# Patient Record
Sex: Female | Born: 1990 | Race: Black or African American | Hispanic: No | Smoking: Never smoker
Health system: Southern US, Community
[De-identification: ages and names within clinical notes are randomized; demographics above are authoritative.]

## PROBLEM LIST (undated history)

## (undated) DIAGNOSIS — F419 Anxiety disorder, unspecified: Secondary | ICD-10-CM

## (undated) DIAGNOSIS — F32A Depression, unspecified: Secondary | ICD-10-CM

## (undated) DIAGNOSIS — L739 Follicular disorder, unspecified: Secondary | ICD-10-CM

## (undated) DIAGNOSIS — F329 Major depressive disorder, single episode, unspecified: Secondary | ICD-10-CM

## (undated) DIAGNOSIS — J45909 Unspecified asthma, uncomplicated: Secondary | ICD-10-CM

## (undated) DIAGNOSIS — K589 Irritable bowel syndrome without diarrhea: Secondary | ICD-10-CM

## (undated) DIAGNOSIS — F410 Panic disorder [episodic paroxysmal anxiety] without agoraphobia: Secondary | ICD-10-CM

## (undated) DIAGNOSIS — L732 Hidradenitis suppurativa: Secondary | ICD-10-CM

## (undated) DIAGNOSIS — K469 Unspecified abdominal hernia without obstruction or gangrene: Secondary | ICD-10-CM

## (undated) HISTORY — PX: HERNIA REPAIR: SHX51

---

## 2012-07-22 ENCOUNTER — Encounter (HOSPITAL_COMMUNITY): Payer: Self-pay

## 2012-07-22 ENCOUNTER — Emergency Department (HOSPITAL_COMMUNITY)
Admission: EM | Admit: 2012-07-22 | Discharge: 2012-07-22 | Disposition: A | Discharge status: Home / Self Care | Payer: Self-pay | Attending: Emergency Medicine | Admitting: Emergency Medicine

## 2012-07-22 DIAGNOSIS — K529 Noninfective gastroenteritis and colitis, unspecified: Secondary | ICD-10-CM

## 2012-07-22 DIAGNOSIS — Z8719 Personal history of other diseases of the digestive system: Secondary | ICD-10-CM | POA: Insufficient documentation

## 2012-07-22 DIAGNOSIS — R1032 Left lower quadrant pain: Secondary | ICD-10-CM | POA: Insufficient documentation

## 2012-07-22 DIAGNOSIS — R197 Diarrhea, unspecified: Secondary | ICD-10-CM | POA: Insufficient documentation

## 2012-07-22 DIAGNOSIS — J45909 Unspecified asthma, uncomplicated: Secondary | ICD-10-CM | POA: Insufficient documentation

## 2012-07-22 HISTORY — DX: Unspecified asthma, uncomplicated: J45.909

## 2012-07-22 HISTORY — DX: Unspecified abdominal hernia without obstruction or gangrene: K46.9

## 2012-07-22 LAB — COMPREHENSIVE METABOLIC PANEL
ALT: 13 U/L (ref 0–35)
AST: 21 U/L (ref 0–37)
Albumin: 3.6 g/dL (ref 3.5–5.2)
Calcium: 9.4 mg/dL (ref 8.4–10.5)
Sodium: 138 mEq/L (ref 135–145)
Total Protein: 7.1 g/dL (ref 6.0–8.3)

## 2012-07-22 LAB — URINALYSIS, ROUTINE W REFLEX MICROSCOPIC
Glucose, UA: NEGATIVE mg/dL
Hgb urine dipstick: NEGATIVE
Specific Gravity, Urine: 1.02 (ref 1.005–1.030)

## 2012-07-22 MED ORDER — DIPHENOXYLATE-ATROPINE 2.5-0.025 MG PO TABS: 1.0000 | ORAL_TABLET | Freq: Four times a day (QID) | ORAL | Status: DC | PRN

## 2012-07-22 MED ORDER — SODIUM CHLORIDE 0.9 % IV SOLN
20.0000 mL | INTRAVENOUS | Status: DC
  Administered 2012-07-22: 20 mL via INTRAVENOUS

## 2012-07-22 NOTE — ED Notes (Signed)
Patient did not acknowledge pain upon deep palpation-stated it just feels funny.

## 2012-07-22 NOTE — ED Notes (Signed)
Patient has been having diarrhea since January and nausea since about 2-3 weeks ago. Pain is relieved when she lays supine or flat on back. Taking 800mg  ibuprofen which also relieves pain. Patient feeling bloated most of day is passing flatus. Stomach churns as though she is [place].States she feels as though she is

## 2012-07-22 NOTE — ED Notes (Signed)
Pt presents with NAD- Pt reports diarrhea since January however increased over pass two weeks.  increased stomach pain started yesterday.  C/o of swelling of feet and leg swelling.  C/o of nausea only

## 2012-07-22 NOTE — ED Provider Notes (Signed)
History     CSN: [id number]  Arrival date & time 07/22/12  1341   First MD Initiated Contact with Patient 07/22/12 1500      Chief Complaint  Patient presents with  . Diarrhea    x 2 weeks  . Abdominal Pain    (Consider location/radiation/quality/duration/timing/severity/associated sxs/prior treatment) HPI Comments: 22 y.o. Female presents complaining of waxing and waning central and lower left abdominal pain since October, diarrhea on and off since January, and unilateral edema of left lower extremity for the last year.   Pt denies fever, chest pain, shortness of breath, headache, exogenous estrogen use, smoking, recent travel/surgery in the last 4 weeks.   Pt has taken no interventions. Nothing maks it better or worse.    Patient is a 22 y.o. female presenting with diarrhea and abdominal pain.  Diarrhea Associated symptoms: abdominal pain   Associated symptoms: no diaphoresis, no fever, no headaches and no vomiting   Abdominal Pain Associated symptoms: diarrhea   Associated symptoms: no chest pain, no constipation, no dysuria, no fever, no nausea, no shortness of breath and no vomiting     Past Medical History  Diagnosis Date  . Asthma   . Hernia     History reviewed. No pertinent past surgical history.  No family history on file.  History  Substance Use Topics  . Smoking status: Never Smoker   . Smokeless tobacco: Not on file  . Alcohol Use: No    OB History   Grav Para Term Preterm Abortions TAB SAB Ect Mult Living                  Review of Systems  Constitutional: Negative for fever and diaphoresis.  HENT: Negative for neck pain and neck stiffness.   Eyes: Negative for visual disturbance.  Respiratory: Negative for apnea, chest tightness and shortness of breath.   Cardiovascular: Negative for chest pain and palpitations.  Gastrointestinal: Positive for abdominal pain and diarrhea. Negative for nausea, vomiting and constipation.       Indicates  central abdominal pain  Genitourinary: Negative for dysuria.  Musculoskeletal: Negative for gait problem.  Skin: Negative for rash.  Neurological: Negative for dizziness, weakness, light-headedness, numbness and headaches.    Allergies  Review of patient's allergies indicates no known allergies.  Home Medications   Current Outpatient Rx  Name  Route  Sig  Dispense  Refill  . ibuprofen (ADVIL,MOTRIN) 800 MG tablet   Oral   Take 800 mg by mouth every 8 (eight) hours as needed for pain.           BP 119/75  Pulse 93  Temp(Src) 98.3 F (36.8 C) (Oral)  Resp 16  Ht 5\' 3"  (1.6 m)  Wt 170 lb (77.111 kg)  BMI 30.12 kg/m2  SpO2 100%  LMP 07/03/2012  Physical Exam  Nursing note and vitals reviewed. Constitutional: She is oriented to person, place, and time. She appears well-developed and well-nourished. No distress.  HENT:  Head: Normocephalic and atraumatic.  Eyes: Conjunctivae and EOM are normal.  Neck: Normal range of motion. Neck supple.  No meningeal signs  Cardiovascular: Normal rate, regular rhythm and normal heart sounds.  Exam reveals no gallop and no friction rub.   No murmur heard. Pulmonary/Chest: Effort normal and breath sounds normal. No respiratory distress. She has no wheezes. She has no rales. She exhibits no tenderness.  Abdominal: Soft. Bowel sounds are normal. She exhibits no distension and no mass. There is tenderness. There is no  rebound and no guarding.  Tender to palpation left lower quadrant, no pain at McBurney's point, no murphy sign  Musculoskeletal: Normal range of motion. She exhibits no edema and no tenderness.  5/5 strength throughout. Good grip strength  Neurological: She is alert and oriented to person, place, and time. No cranial nerve deficit.  No focal deficits  Skin: Skin is warm and dry. She is not diaphoretic. No erythema.  Psychiatric: She has a normal mood and affect.    ED Course  Procedures (including critical care time)  Labs  Reviewed  COMPREHENSIVE METABOLIC PANEL  LIPASE, BLOOD  URINALYSIS, ROUTINE W REFLEX MICROSCOPIC   No results found.   1. Chronic diarrhea       MDM  Low suspicion for pancreatitis or hernia. More suspecting for an IBS. Patient is nontoxic, nonseptic appearing, in no apparent distress.   Labs, imaging and vitals reviewed.  Patient does not meet the SIRS or Sepsis criteria.  On repeat exam patient does not have a surgical abdomen and there are no peritoneal signs.  No indication of appendicitis, bowel obstruction, bowel perforation, cholecystitis, diverticulitis.  Patient discharged home with symptomatic treatment and given strict instructions for follow-up with their primary care physician.  I have also discussed reasons to return immediately to the ER.  Patient expresses understanding and agrees with plan. Discussed with Dr. Adriana Simas who is in agreement with plan.      Glade Nurse, PA-C 07/22/12 2146

## 2012-07-24 NOTE — ED Provider Notes (Signed)
Medical screening examination/treatment/procedure(s) were conducted as a shared visit with non-physician practitioner(s) and myself.  I personally evaluated the patient during the encounter.  No acute abd.  Non toxic  Donnetta Hutching, MD 07/24/12 1052

## 2013-09-12 ENCOUNTER — Other Ambulatory Visit: Payer: Self-pay

## 2013-09-12 ENCOUNTER — Encounter (HOSPITAL_COMMUNITY): Payer: Self-pay | Admitting: Emergency Medicine

## 2013-09-12 ENCOUNTER — Emergency Department (HOSPITAL_COMMUNITY)
Admission: EM | Admit: 2013-09-12 | Discharge: 2013-09-12 | Disposition: A | Discharge status: Home / Self Care | Payer: Self-pay | Attending: Emergency Medicine | Admitting: Emergency Medicine

## 2013-09-12 DIAGNOSIS — S0990XA Unspecified injury of head, initial encounter: Secondary | ICD-10-CM | POA: Insufficient documentation

## 2013-09-12 DIAGNOSIS — Z79899 Other long term (current) drug therapy: Secondary | ICD-10-CM | POA: Insufficient documentation

## 2013-09-12 DIAGNOSIS — R296 Repeated falls: Secondary | ICD-10-CM | POA: Insufficient documentation

## 2013-09-12 DIAGNOSIS — Z3202 Encounter for pregnancy test, result negative: Secondary | ICD-10-CM | POA: Insufficient documentation

## 2013-09-12 DIAGNOSIS — R42 Dizziness and giddiness: Secondary | ICD-10-CM | POA: Insufficient documentation

## 2013-09-12 DIAGNOSIS — R55 Syncope and collapse: Secondary | ICD-10-CM | POA: Insufficient documentation

## 2013-09-12 DIAGNOSIS — Z8719 Personal history of other diseases of the digestive system: Secondary | ICD-10-CM | POA: Insufficient documentation

## 2013-09-12 DIAGNOSIS — Y9289 Other specified places as the place of occurrence of the external cause: Secondary | ICD-10-CM | POA: Insufficient documentation

## 2013-09-12 DIAGNOSIS — J45909 Unspecified asthma, uncomplicated: Secondary | ICD-10-CM | POA: Insufficient documentation

## 2013-09-12 DIAGNOSIS — Y9389 Activity, other specified: Secondary | ICD-10-CM | POA: Insufficient documentation

## 2013-09-12 LAB — I-STAT CHEM 8, ED
BUN: 11 mg/dL (ref 6–23)
CALCIUM ION: 1.15 mmol/L (ref 1.12–1.23)
CREATININE: 1 mg/dL (ref 0.50–1.10)
Chloride: 106 mEq/L (ref 96–112)
GLUCOSE: 89 mg/dL (ref 70–99)
HCT: 40 % (ref 36.0–46.0)
HEMOGLOBIN: 13.6 g/dL (ref 12.0–15.0)
POTASSIUM: 4 meq/L (ref 3.7–5.3)
Sodium: 139 mEq/L (ref 137–147)
TCO2: 24 mmol/L (ref 0–100)

## 2013-09-12 LAB — POC URINE PREG, ED: Preg Test, Ur: NEGATIVE

## 2013-09-12 MED ORDER — ACETAMINOPHEN 500 MG PO TABS
1000.0000 mg | ORAL_TABLET | Freq: Once | ORAL | Status: AC
  Administered 2013-09-12: 1000 mg via ORAL
  Filled 2013-09-12: qty 2

## 2013-09-12 MED ORDER — SODIUM CHLORIDE 0.9 % IV BOLUS (SEPSIS): 1000.0000 mL | Freq: Once | INTRAVENOUS | Status: DC

## 2013-09-12 NOTE — ED Provider Notes (Signed)
CSN: [id number]     Arrival date & time 09/12/13  2029 History   First MD Initiated Contact with Patient 09/12/13 2030     Chief Complaint  Patient presents with  . Loss of Consciousness     (Consider location/radiation/quality/duration/timing/severity/associated sxs/prior Treatment) Patient is a 23 y.o. female presenting with syncope. The history is provided by the patient.  Loss of Consciousness Episode history:  Single Most recent episode:  Today Duration:  30 seconds Timing:  Rare Progression:  Resolved Chronicity:  New Context comment:  Bending over at work Witnessed: yes   Relieved by:  Nothing Worsened by:  Nothing tried Ineffective treatments:  None tried Associated symptoms: dizziness (Just prior to falling) and headaches (After falling)   Associated symptoms: no anxiety, no confusion, no diaphoresis, no fever, no focal weakness, no palpitations, no shortness of breath, no vomiting and no weakness     Past Medical History  Diagnosis Date  . Asthma   . Hernia    History reviewed. No pertinent past surgical history. No family history on file. History  Substance Use Topics  . Smoking status: Never Smoker   . Smokeless tobacco: Not on file  . Alcohol Use: No   OB History   Grav Para Term Preterm Abortions TAB SAB Ect Mult Living                 Review of Systems  Constitutional: Negative for fever and diaphoresis.  Respiratory: Negative for shortness of breath.   Cardiovascular: Positive for syncope. Negative for palpitations.  Gastrointestinal: Negative for vomiting.  Neurological: Positive for dizziness (Just prior to falling) and headaches (After falling). Negative for focal weakness and weakness.  Psychiatric/Behavioral: Negative for confusion.  All other systems reviewed and are negative.     Allergies  Review of patient's allergies indicates no known allergies.  Home Medications   Prior to Admission medications   Medication Sig Start Date End  Date Taking? Authorizing Provider  drospirenone-ethinyl estradiol (YAZ,GIANVI,LORYNA) 3-0.02 MG tablet Take 1 tablet by mouth daily.   Yes Historical Provider, MD  FLUoxetine (PROZAC) 10 MG tablet Take 30 mg by mouth daily.   Yes Historical Provider, MD   BP 113/76  Pulse 87  Temp(Src) 98.8 F (37.1 C) (Oral)  Resp 19  SpO2 99% Physical Exam  Constitutional: She is oriented to person, place, and time. She appears well-developed and well-nourished. No distress.  HENT:  Head: Normocephalic and atraumatic.  Eyes: Conjunctivae are normal.  Neck: Neck supple. No tracheal deviation present.  Cardiovascular: Normal rate and regular rhythm.   Pulmonary/Chest: Effort normal. No respiratory distress.  Abdominal: Soft. She exhibits no distension. There is no tenderness.  Neurological: She is alert and oriented to person, place, and time. She has normal strength. No cranial nerve deficit or sensory deficit. Coordination and gait normal. GCS eye subscore is 4. GCS verbal subscore is 5. GCS motor subscore is 6.  Skin: Skin is warm and dry.  Psychiatric: She has a normal mood and affect.    ED Course  Procedures (including critical care time) Labs Review Labs Reviewed  POC URINE PREG, ED  I-STAT CHEM 8, ED    Imaging Review No results found.  EKG interpretation  Date: 09/12/2013  Rate: 87  Rhythm: normal sinus rhythm  QRS Axis: normal  Intervals: normal  ST/T Wave abnormalities: normal and early repolarization  Conduction Disutrbances:none  Narrative Interpretation:   Old EKG Reviewed: none available     MDM   Final  diagnoses:  None   23 y.o. female presents with a syncopal event while at work. She was filling up a popcorn container with butter and felt lightheaded and then the next thing she remembers she was lying on the floor and being helped up by a coworker. She does not have a history of losing consciousness in the past. EKG without ST or T wave changes concerning for  myocardial ischemia. No delta wave, no prolonged QTc, no brugada to suggest arrhythmogenicity. She is not exhibiting orthostasis at this time. She is on her menstrual cycle currently and states it has been happening longer than it normally does despite her birth control regimen. Electrolytes and hemoglobin are normal so not symptomatic anemia. No recurrence during emergency department stay, safe for discharge home with return precautions for repeat events and primary care followup for routine health maintenance.     Lyndal Pulleyaniel Amneet Cendejas, MD 09/12/13 2152

## 2013-09-12 NOTE — Discharge Instructions (Signed)
Syncope  Syncope is a fainting spell. This means the person loses consciousness and drops to the ground. The person is generally unconscious for less than 5 minutes. The person may have some muscle twitches for up to 15 seconds before waking up and returning to normal. Syncope occurs more often in elderly people, but it can happen to anyone. While most causes of syncope are not dangerous, syncope can be a sign of a serious medical problem. It is important to seek medical care.   CAUSES   Syncope is caused by a sudden decrease in blood flow to the brain. The specific cause is often not determined. Factors that can trigger syncope include:   Taking medicines that lower blood pressure.   Sudden changes in posture, such as standing up suddenly.   Taking more medicine than prescribed.   Standing in one place for too long.   Seizure disorders.   Dehydration and excessive exposure to heat.   Low blood sugar (hypoglycemia).   Straining to have a bowel movement.   Heart disease, irregular heartbeat, or other circulatory problems.   Fear, emotional distress, seeing blood, or severe pain.  SYMPTOMS   Right before fainting, you may:   Feel dizzy or lightheaded.   Feel nauseous.   See all white or all black in your field of vision.   Have cold, clammy skin.  DIAGNOSIS   Your caregiver will ask about your symptoms, perform a physical exam, and perform electrocardiography (ECG) to record the electrical activity of your heart. Your caregiver may also perform other heart or blood tests to determine the cause of your syncope.  TREATMENT   In most cases, no treatment is needed. Depending on the cause of your syncope, your caregiver may recommend changing or stopping some of your medicines.  HOME CARE INSTRUCTIONS   Have someone stay with you until you feel stable.   Do not drive, operate machinery, or play sports until your caregiver says it is okay.   Keep all follow-up appointments as directed by your  caregiver.   Lie down right away if you start feeling like you might faint. Breathe deeply and steadily. Wait until all the symptoms have passed.   Drink enough fluids to keep your urine clear or pale yellow.   If you are taking blood pressure or heart medicine, get up slowly, taking several minutes to sit and then stand. This can reduce dizziness.  SEEK IMMEDIATE MEDICAL CARE IF:    You have a severe headache.   You have unusual pain in the chest, abdomen, or back.   You are bleeding from the mouth or rectum, or you have black or tarry stool.   You have an irregular or very fast heartbeat.   You have pain with breathing.   You have repeated fainting or seizure-like jerking during an episode.   You faint when sitting or lying down.   You have confusion.   You have difficulty walking.   You have severe weakness.   You have vision problems.  If you fainted, call your local emergency services (911 in [place] Do not drive yourself to the hospital.   MAKE SURE YOU:   Understand these instructions.   Will watch your condition.   Will get help right away if you are not doing well or get worse.  Document Released: 04/18/2005 Document Revised: 10/18/2011 Document Reviewed: 06/17/2011  ExitCare Patient Information 2014 [employer].

## 2013-09-12 NOTE — ED Notes (Signed)
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 Pt does not recall passing out. Remember people standing around her when she woke up. Pt currently Ax4, NAD at this time. Pt has 20g IV started by EMS. Able to answer all questions appropriately.

## 2013-09-13 NOTE — ED Provider Notes (Signed)
I saw and evaluated the patient, reviewed the resident's note and I agree with the findings and plan.  Date: 09/12/2013  Rate: 87  Rhythm: normal sinus rhythm  QRS Axis: normal  Intervals: normal  ST/T Wave abnormalities: normal and early repolarization  Conduction Disutrbances:none  Narrative Interpretation:  Old EKG Reviewed: none available   52F here with syncopal episode. Happened while at work and was yawning. Relaxing comfortably now. No preceding symptoms. Patient's EKG ok, labs ok. Stable for discharge.     Dagmar HaitWilliam Uriyah Raska, MD 09/13/13 [phone]

## 2018-04-23 ENCOUNTER — Other Ambulatory Visit: Payer: Self-pay

## 2018-04-23 ENCOUNTER — Encounter (HOSPITAL_COMMUNITY): Payer: Self-pay

## 2018-04-23 ENCOUNTER — Emergency Department (HOSPITAL_COMMUNITY): Payer: Medicaid Other

## 2018-04-23 ENCOUNTER — Emergency Department (HOSPITAL_COMMUNITY)
Admission: EM | Admit: 2018-04-23 | Discharge: 2018-04-23 | Disposition: A | Discharge status: Home / Self Care | Payer: Medicaid Other | Attending: Emergency Medicine | Admitting: Emergency Medicine

## 2018-04-23 DIAGNOSIS — Z3A09 9 weeks gestation of pregnancy: Secondary | ICD-10-CM | POA: Diagnosis not present

## 2018-04-23 DIAGNOSIS — R1030 Lower abdominal pain, unspecified: Secondary | ICD-10-CM

## 2018-04-23 DIAGNOSIS — O26891 Other specified pregnancy related conditions, first trimester: Secondary | ICD-10-CM | POA: Insufficient documentation

## 2018-04-23 DIAGNOSIS — Z3490 Encounter for supervision of normal pregnancy, unspecified, unspecified trimester: Secondary | ICD-10-CM

## 2018-04-23 LAB — CBC WITH DIFFERENTIAL/PLATELET
ABS IMMATURE GRANULOCYTES: 0.02 10*3/uL (ref 0.00–0.07)
Basophils Absolute: 0 10*3/uL (ref 0.0–0.1)
Basophils Relative: 0 %
Eosinophils Absolute: 0.1 10*3/uL (ref 0.0–0.5)
Eosinophils Relative: 1 %
HEMATOCRIT: 37 % (ref 36.0–46.0)
Hemoglobin: 11.9 g/dL — ABNORMAL LOW (ref 12.0–15.0)
IMMATURE GRANULOCYTES: 0 %
LYMPHS ABS: 1.5 10*3/uL (ref 0.7–4.0)
Lymphocytes Relative: 21 %
MCH: 28.5 pg (ref 26.0–34.0)
MCHC: 32.2 g/dL (ref 30.0–36.0)
MCV: 88.7 fL (ref 80.0–100.0)
MONOS PCT: 9 %
Monocytes Absolute: 0.6 10*3/uL (ref 0.1–1.0)
NEUTROS ABS: 4.9 10*3/uL (ref 1.7–7.7)
NEUTROS PCT: 69 %
Platelets: 205 10*3/uL (ref 150–400)
RBC: 4.17 MIL/uL (ref 3.87–5.11)
RDW: 17.1 % — ABNORMAL HIGH (ref 11.5–15.5)
WBC: 7.1 10*3/uL (ref 4.0–10.5)
nRBC: 0 % (ref 0.0–0.2)

## 2018-04-23 LAB — COMPREHENSIVE METABOLIC PANEL
ALBUMIN: 3.2 g/dL — AB (ref 3.5–5.0)
ALK PHOS: 44 U/L (ref 38–126)
ALT: 14 U/L (ref 0–44)
ANION GAP: 6 (ref 5–15)
AST: 19 U/L (ref 15–41)
BUN: 5 mg/dL — ABNORMAL LOW (ref 6–20)
CALCIUM: 8.9 mg/dL (ref 8.9–10.3)
CO2: 22 mmol/L (ref 22–32)
Chloride: 109 mmol/L (ref 98–111)
Creatinine, Ser: 0.73 mg/dL (ref 0.44–1.00)
GFR calc Af Amer: >60 mL/min (ref >60)
GFR calc non Af Amer: >60 mL/min (ref >60)
GLUCOSE: 84 mg/dL (ref 70–99)
POTASSIUM: 4 mmol/L (ref 3.5–5.1)
SODIUM: 137 mmol/L (ref 135–145)
Total Bilirubin: 0.4 mg/dL (ref 0.3–1.2)
Total Protein: 6.1 g/dL — ABNORMAL LOW (ref 6.5–8.1)

## 2018-04-23 LAB — URINALYSIS, ROUTINE W REFLEX MICROSCOPIC
Bilirubin Urine: NEGATIVE
GLUCOSE, UA: NEGATIVE mg/dL
HGB URINE DIPSTICK: NEGATIVE
Ketones, ur: 5 mg/dL — AB
NITRITE: NEGATIVE
PROTEIN: NEGATIVE mg/dL
SPECIFIC GRAVITY, URINE: 1.01 (ref 1.005–1.030)
pH: 7 (ref 5.0–8.0)

## 2018-04-23 LAB — WET PREP, GENITAL
Clue Cells Wet Prep HPF POC: NONE SEEN
SPERM: NONE SEEN
Trich, Wet Prep: NONE SEEN
YEAST WET PREP: NONE SEEN

## 2018-04-23 LAB — PREGNANCY, URINE: Preg Test, Ur: POSITIVE — AB

## 2018-04-23 LAB — HCG, QUANTITATIVE, PREGNANCY: hCG, Beta Chain, Quant, S: 300330 m[IU]/mL — ABNORMAL HIGH (ref <5)

## 2018-04-23 NOTE — ED Provider Notes (Signed)
Patient care was taken over from Dr. Jacqulyn BathLong.  Pelvic ultrasound shows a normal intrauterine pregnancy with no obvious complicating features.  Her urinalysis does not appear consistent with infection.  It was sent for culture.  She is currently not having significant abdominal pain.  She has an appointment with the health department on Friday.  She is taking a prenatal vitamin.  Return precautions were given.   Rolan BuccoBelfi, Sephiroth Mcluckie, MD 04/23/18 [phone]

## 2018-04-23 NOTE — ED Triage Notes (Signed)
Pt reports abdominal pain for the last two days that radiates into her lower back. Pt did have one episode of diarrhea this morning. Pt states that she is pregnant with her LMP being 02/12/18. Pt reports that her first OB appointment is on Friday.

## 2018-04-23 NOTE — ED Notes (Signed)
Patient verbalizes understanding of medications and discharge instructions. No further questions at this time. VSS and patient ambulatory at discharge.   

## 2018-04-23 NOTE — ED Provider Notes (Signed)
Emergency Department Provider Note   I have reviewed the triage vital signs and the nursing notes.   HISTORY  Chief Complaint Abdominal Pain   HPI Cheyenne Sutton is a 27 y.o. female with PMH of hernia presents to the emergency department with lower abdominal pain radiating to the back in the setting of early pregnancy.  The patient's last menstrual cycle was 02/12/2018.  The patient has not seen her OB/GYN but is scheduled to go later this week.  Is her first pregnancy.  No vaginal bleeding but she has noticed some discharge over the past several weeks.  No concern for STD.  Her lower abdominal pain is intermittent and radiates to the back.  No upper abdominal discomfort.  No diarrhea, vomiting, or fevers.   Past Medical History:  Diagnosis Date  . Asthma   . Hernia     There are no active problems to display for this patient.   No past surgical history on file.  Allergies Patient has no known allergies.  No family history on file.  Social History Social History   Tobacco Use  . Smoking status: Never Smoker  Substance Use Topics  . Alcohol use: No  . Drug use: No    Review of Systems  Constitutional: No fever/chills Eyes: No visual changes. ENT: No sore throat. Cardiovascular: Denies chest pain. Respiratory: Denies shortness of breath. Gastrointestinal: Positive lower abdominal pain.  No nausea, no vomiting.  No diarrhea.  No constipation. Genitourinary: Negative for dysuria. Positive vaginal discharge.  Musculoskeletal: Negative for back pain. Skin: Negative for rash. Neurological: Negative for headaches, focal weakness or numbness.  10-point ROS otherwise negative.  ____________________________________________   PHYSICAL EXAM:  VITAL SIGNS: Vitals:   04/23/18 1242 04/23/18 1415  BP: 114/86 104/65  Pulse:  71  Resp:  18  SpO2:  100%   Constitutional: Alert and oriented. Well appearing and in no acute distress. Eyes: Conjunctivae are normal.    Head: Atraumatic. Nose: No congestion/rhinnorhea. Mouth/Throat: Mucous membranes are moist.  Neck: No stridor.   Cardiovascular: Normal rate, regular rhythm. Good peripheral circulation. Grossly normal heart sounds.   Respiratory: Normal respiratory effort.  No retractions. Lungs CTAB. Gastrointestinal: Soft and nontender. No distention.  Genitourinary: Moderate white discharge. Normal, closed cervix. Mild adnexal tenderness. No vaginal bleeding.  Musculoskeletal: No lower extremity tenderness nor edema. No gross deformities of extremities. Neurologic:  Normal speech and language. No gross focal neurologic deficits are appreciated.  Skin:  Skin is warm, dry and intact. No rash noted.  ____________________________________________   LABS (all labs ordered are listed, but only abnormal results are displayed)  Labs Reviewed  WET PREP, GENITAL - Abnormal; Notable for the following components:      Result Value   WBC, Wet Prep HPF POC MANY (*)    All other components within normal limits  COMPREHENSIVE METABOLIC PANEL - Abnormal; Notable for the following components:   BUN 5 (*)    Total Protein 6.1 (*)    Albumin 3.2 (*)    All other components within normal limits  CBC WITH DIFFERENTIAL/PLATELET - Abnormal; Notable for the following components:   Hemoglobin 11.9 (*)    RDW 17.1 (*)    All other components within normal limits  HCG, QUANTITATIVE, PREGNANCY - Abnormal; Notable for the following components:   hCG, Beta Chain, Quant, S 300,330 (*)    All other components within normal limits  PREGNANCY, URINE  URINALYSIS, ROUTINE W REFLEX MICROSCOPIC  HIV ANTIBODY (ROUTINE TESTING W  REFLEX)  GC/CHLAMYDIA PROBE AMP ([place]) NOT AT [facility]   ____________________________________________  RADIOLOGY  Pelvic US pending.  ____________________________________________   PROCEDURES  Procedure(s) performed:   Procedures  None   ____________________________________________   INITIAL IMPRESSION / ASSESSMENT AND PLAN / ED COURSE  Pertinent labs & imaging results that were available during my care of the patient were reviewed by me and considered in my medical decision making (see chart for details).  Patient presents to the emergency department for evaluation of the lower abdominal pain and pregnancy.  She is approximately 10 weeks by dates with no OB follow-up at this time but has a appointment scheduled later this week.  No focal tenderness on exam.  Pelvic exam pending.   Labs reviewed. UA and pelvic US pending. Care transferred to Dr. Fredderick PhenixBelfi. Patient has OB f/u later this week.  ____________________________________________  FINAL CLINICAL IMPRESSION(S) / ED DIAGNOSES  Final diagnoses:  Lower abdominal pain  Pregnancy, unspecified gestational age    Note:  This document was prepared using Dragon voice recognition software and may include unintentional dictation errors.  Alona BeneJoshua Long, MD Emergency Medicine    Long, Arlyss RepressJoshua G, MD 04/23/18 [phone]

## 2018-04-24 LAB — HIV ANTIBODY (ROUTINE TESTING W REFLEX): HIV Screen 4th Generation wRfx: NONREACTIVE

## 2018-04-24 LAB — GC/CHLAMYDIA PROBE AMP (~~LOC~~) NOT AT ARMC
CHLAMYDIA, DNA PROBE: NEGATIVE
NEISSERIA GONORRHEA: NEGATIVE

## 2018-04-25 LAB — URINE CULTURE: Culture: >=100000 — AB

## 2018-04-26 ENCOUNTER — Telehealth: Payer: Self-pay | Admitting: Emergency Medicine

## 2018-04-26 NOTE — Progress Notes (Signed)
ED Antimicrobial Stewardship Positive Culture Follow Up   Jamison NeighborSage Neighbors is an 27 y.o. female who [facility] with a chief complaint of Abdominal pain in setting of early pregnancy Chief Complaint  Patient presents with  . Abdominal Pain    Recent Results (from the past 720 hour(s))  Wet prep, genital     Status: Abnormal   Collection Time: 04/23/18  2:03 PM  Result Value Ref Range Status   Yeast Wet Prep HPF POC NONE SEEN NONE SEEN Final   Trich, Wet Prep NONE SEEN NONE SEEN Final   Clue Cells Wet Prep HPF POC NONE SEEN NONE SEEN Final   WBC, Wet Prep HPF POC MANY (A) NONE SEEN Final   Sperm NONE SEEN  Final    Comment: Performed at [facility], [address] [place], [place] [address]  Urine culture     Status: Abnormal   Collection Time: 04/23/18  6:50 PM  Result Value Ref Range Status   Specimen Description URINE, RANDOM  Final   Special Requests   Final    NONE Performed at [facility], [address] [place], [place] [address]    Culture >=100,000 COLONIES/mL ESCHERICHIA COLI (A)  Final   Report Status 04/25/2018 FINAL  Final   Organism ID, Bacteria ESCHERICHIA COLI (A)  Final      Susceptibility   Escherichia coli - MIC*    AMPICILLIN >=32 RESISTANT Resistant     CEFAZOLIN <=4 SENSITIVE Sensitive     CEFTRIAXONE <=1 SENSITIVE Sensitive     CIPROFLOXACIN <=0.25 SENSITIVE Sensitive     GENTAMICIN <=1 SENSITIVE Sensitive     IMIPENEM <=0.25 SENSITIVE Sensitive     NITROFURANTOIN <=16 SENSITIVE Sensitive     TRIMETH/SULFA <=20 SENSITIVE Sensitive     AMPICILLIN/SULBACTAM 16 INTERMEDIATE Intermediate     PIP/TAZO <=4 SENSITIVE Sensitive     Extended ESBL NEGATIVE Sensitive     * >=100,000 COLONIES/mL ESCHERICHIA COLI    [x]  Patient discharged originally without antimicrobial agent and treatment is now indicated  New antibiotic prescription: Keflex 500mg  BID x 7d  ED Provider: Claude MangesJohana Soto, Migdalia DkPA-C   Aloysius Heinle 04/26/2018, 1:33 PM Clinical Pharmacist Monday - Friday phone -  [phone] Saturday - Sunday phone - [phone]

## 2018-04-26 NOTE — Telephone Encounter (Signed)
Post ED Visit - Positive Culture Follow-up: Successful Patient Follow-Up  Culture assessed and recommendations reviewed by:  []  Enzo BiNathan Batchelder, Pharm.D. []  Celedonio MiyamotoJeremy Frens, Pharm.D., BCPS AQ-ID []  Garvin FilaMike Maccia, Pharm.D., BCPS []  Georgina PillionElizabeth Martin, Pharm.D., BCPS []  ChilhowieMinh Pham, [place]arm.D., BCPS, AAHIVP []  Estella HuskMichelle Turner, Pharm.D., BCPS, AAHIVP []  Lysle Pearlachel Rumbarger, PharmD, BCPS []  Phillips Climeshuy Dang, PharmD, BCPS []  Agapito GamesAlison Masters, PharmD, BCPS []  Daylene PoseyJonathan Oriet, PharmD  Positive urine culture  [x]  Patient discharged without antimicrobial prescription and treatment is now indicated []  Organism is resistant to prescribed ED discharge antimicrobial []  Patient with positive blood cultures  Changes discussed with ED provider: Claude MangesJohana Soto PA New antibiotic prescription Keflex 500 mg BID x seven days [facility] [phone] [facility]  Contacted patient, date 04/26/2018, time 1220   [facility] Ninneman 04/26/2018, 12:22 PM

## 2018-05-04 ENCOUNTER — Other Ambulatory Visit (HOSPITAL_COMMUNITY): Payer: Self-pay | Admitting: Nurse Practitioner

## 2018-05-04 DIAGNOSIS — Z3A13 13 weeks gestation of pregnancy: Secondary | ICD-10-CM

## 2018-05-04 DIAGNOSIS — Z818 Family history of other mental and behavioral disorders: Secondary | ICD-10-CM

## 2018-05-11 ENCOUNTER — Encounter (HOSPITAL_COMMUNITY): Payer: Self-pay

## 2018-05-16 ENCOUNTER — Encounter (HOSPITAL_COMMUNITY): Payer: Self-pay | Admitting: *Deleted

## 2018-05-18 ENCOUNTER — Ambulatory Visit (HOSPITAL_COMMUNITY)
Admission: RE | Admit: 2018-05-18 | Discharge: 2018-05-18 | Disposition: A | Discharge status: Home / Self Care | Payer: Medicaid Other | Source: Ambulatory Visit | Attending: Nurse Practitioner | Admitting: Nurse Practitioner

## 2018-05-18 ENCOUNTER — Ambulatory Visit (HOSPITAL_COMMUNITY): Payer: Self-pay | Admitting: Nurse Practitioner

## 2018-05-18 ENCOUNTER — Encounter (HOSPITAL_COMMUNITY): Payer: Self-pay

## 2018-05-18 DIAGNOSIS — Z3686 Encounter for antenatal screening for cervical length: Secondary | ICD-10-CM

## 2018-05-18 DIAGNOSIS — Z3A13 13 weeks gestation of pregnancy: Secondary | ICD-10-CM

## 2018-05-18 DIAGNOSIS — Z818 Family history of other mental and behavioral disorders: Secondary | ICD-10-CM | POA: Diagnosis present

## 2018-05-18 HISTORY — DX: Irritable bowel syndrome, unspecified: K58.9

## 2018-05-18 HISTORY — DX: Depression, unspecified: F32.A

## 2018-05-18 HISTORY — DX: Major depressive disorder, single episode, unspecified: F32.9

## 2018-05-18 HISTORY — DX: Follicular disorder, unspecified: L73.9

## 2018-05-18 HISTORY — DX: Anxiety disorder, unspecified: F41.9

## 2018-05-18 HISTORY — DX: Hidradenitis suppurativa: L73.2

## 2018-05-21 LAB — HEMOGLOBINOPATHY EVALUATION
HGB A2 QUANT: 1.6 % — AB (ref 1.8–3.2)
HGB S QUANTITAION: 0 %
Hgb A: 98.4 % (ref 96.4–98.8)
Hgb C: 0 %
Hgb F Quant: 0 % (ref 0.0–2.0)
Hgb Variant: 0 %

## 2018-05-23 ENCOUNTER — Other Ambulatory Visit (HOSPITAL_COMMUNITY): Payer: Self-pay | Admitting: Nurse Practitioner

## 2018-05-24 ENCOUNTER — Other Ambulatory Visit (HOSPITAL_COMMUNITY): Payer: Self-pay | Admitting: Nurse Practitioner

## 2018-05-28 ENCOUNTER — Other Ambulatory Visit (HOSPITAL_COMMUNITY): Payer: Self-pay | Admitting: Nurse Practitioner

## 2018-05-31 ENCOUNTER — Other Ambulatory Visit (HOSPITAL_COMMUNITY): Payer: Self-pay | Admitting: Nurse Practitioner

## 2018-05-31 DIAGNOSIS — Z3689 Encounter for other specified antenatal screening: Secondary | ICD-10-CM

## 2018-06-29 ENCOUNTER — Other Ambulatory Visit (HOSPITAL_COMMUNITY): Payer: Medicaid Other

## 2018-06-29 ENCOUNTER — Ambulatory Visit (HOSPITAL_COMMUNITY)
Admission: RE | Admit: 2018-06-29 | Discharge: 2018-06-29 | Disposition: A | Discharge status: Home / Self Care | Payer: Medicaid Other | Source: Ambulatory Visit | Attending: Nurse Practitioner | Admitting: Nurse Practitioner

## 2018-06-29 ENCOUNTER — Other Ambulatory Visit (HOSPITAL_COMMUNITY): Payer: Self-pay | Admitting: *Deleted

## 2018-06-29 ENCOUNTER — Other Ambulatory Visit (HOSPITAL_COMMUNITY): Payer: Self-pay | Admitting: Nurse Practitioner

## 2018-06-29 DIAGNOSIS — Z3689 Encounter for other specified antenatal screening: Secondary | ICD-10-CM | POA: Diagnosis not present

## 2018-06-29 DIAGNOSIS — Z3A19 19 weeks gestation of pregnancy: Secondary | ICD-10-CM

## 2018-06-29 DIAGNOSIS — Z363 Encounter for antenatal screening for malformations: Secondary | ICD-10-CM

## 2018-06-29 DIAGNOSIS — O4440 Low lying placenta NOS or without hemorrhage, unspecified trimester: Secondary | ICD-10-CM

## 2018-09-07 ENCOUNTER — Encounter (HOSPITAL_COMMUNITY): Payer: Self-pay

## 2018-09-07 ENCOUNTER — Other Ambulatory Visit (HOSPITAL_COMMUNITY): Payer: Self-pay | Admitting: *Deleted

## 2018-09-07 ENCOUNTER — Other Ambulatory Visit: Payer: Self-pay

## 2018-09-07 ENCOUNTER — Ambulatory Visit (HOSPITAL_COMMUNITY)
Admission: RE | Admit: 2018-09-07 | Discharge: 2018-09-07 | Disposition: A | Discharge status: Home / Self Care | Payer: Medicaid Other | Source: Ambulatory Visit | Attending: Obstetrics and Gynecology | Admitting: Obstetrics and Gynecology

## 2018-09-07 ENCOUNTER — Ambulatory Visit (HOSPITAL_COMMUNITY): Payer: Medicaid Other | Admitting: *Deleted

## 2018-09-07 VITALS — Temp 98.6°F

## 2018-09-07 DIAGNOSIS — O4443 Low lying placenta NOS or without hemorrhage, third trimester: Secondary | ICD-10-CM | POA: Diagnosis not present

## 2018-09-07 DIAGNOSIS — O350XX0 Maternal care for (suspected) central nervous system malformation in fetus, not applicable or unspecified: Secondary | ICD-10-CM

## 2018-09-07 DIAGNOSIS — O358XX0 Maternal care for other (suspected) fetal abnormality and damage, not applicable or unspecified: Secondary | ICD-10-CM | POA: Insufficient documentation

## 2018-09-07 DIAGNOSIS — O0990 Supervision of high risk pregnancy, unspecified, unspecified trimester: Secondary | ICD-10-CM

## 2018-09-07 DIAGNOSIS — IMO0002 Reserved for concepts with insufficient information to code with codable children: Secondary | ICD-10-CM

## 2018-09-07 DIAGNOSIS — Z362 Encounter for other antenatal screening follow-up: Secondary | ICD-10-CM | POA: Diagnosis present

## 2018-09-07 DIAGNOSIS — O4440 Low lying placenta NOS or without hemorrhage, unspecified trimester: Secondary | ICD-10-CM

## 2018-09-07 DIAGNOSIS — Z3A29 29 weeks gestation of pregnancy: Secondary | ICD-10-CM | POA: Insufficient documentation

## 2018-10-05 ENCOUNTER — Other Ambulatory Visit: Payer: Self-pay

## 2018-10-05 ENCOUNTER — Other Ambulatory Visit (HOSPITAL_COMMUNITY): Payer: Self-pay | Admitting: *Deleted

## 2018-10-05 ENCOUNTER — Ambulatory Visit (HOSPITAL_COMMUNITY)
Admission: RE | Admit: 2018-10-05 | Discharge: 2018-10-05 | Disposition: A | Discharge status: Home / Self Care | Payer: Medicaid Other | Source: Ambulatory Visit | Attending: Obstetrics and Gynecology | Admitting: Obstetrics and Gynecology

## 2018-10-05 ENCOUNTER — Ambulatory Visit (HOSPITAL_COMMUNITY): Payer: Medicaid Other | Admitting: *Deleted

## 2018-10-05 VITALS — Temp 97.7°F

## 2018-10-05 DIAGNOSIS — O3506X0 Maternal care for (suspected) central nervous system malformation or damage in fetus, hydrocephaly, not applicable or unspecified: Secondary | ICD-10-CM

## 2018-10-05 DIAGNOSIS — O350XX0 Maternal care for (suspected) central nervous system malformation in fetus, not applicable or unspecified: Secondary | ICD-10-CM | POA: Insufficient documentation

## 2018-10-05 DIAGNOSIS — IMO0002 Reserved for concepts with insufficient information to code with codable children: Secondary | ICD-10-CM

## 2018-10-05 DIAGNOSIS — Z3A33 33 weeks gestation of pregnancy: Secondary | ICD-10-CM

## 2018-10-05 DIAGNOSIS — Z362 Encounter for other antenatal screening follow-up: Secondary | ICD-10-CM | POA: Diagnosis present

## 2018-10-25 LAB — OB RESULTS CONSOLE GBS: GBS: NEGATIVE

## 2018-11-01 ENCOUNTER — Other Ambulatory Visit: Payer: Self-pay

## 2018-11-01 ENCOUNTER — Ambulatory Visit (HOSPITAL_COMMUNITY)
Admission: RE | Admit: 2018-11-01 | Discharge: 2018-11-01 | Disposition: A | Discharge status: Home / Self Care | Payer: Medicaid Other | Source: Ambulatory Visit | Attending: Obstetrics and Gynecology | Admitting: Obstetrics and Gynecology

## 2018-11-01 DIAGNOSIS — Z362 Encounter for other antenatal screening follow-up: Secondary | ICD-10-CM | POA: Diagnosis not present

## 2018-11-01 DIAGNOSIS — Z3A37 37 weeks gestation of pregnancy: Secondary | ICD-10-CM | POA: Diagnosis not present

## 2018-11-01 DIAGNOSIS — IMO0002 Reserved for concepts with insufficient information to code with codable children: Secondary | ICD-10-CM

## 2018-11-01 DIAGNOSIS — O350XX0 Maternal care for (suspected) central nervous system malformation in fetus, not applicable or unspecified: Secondary | ICD-10-CM | POA: Diagnosis present

## 2018-11-01 DIAGNOSIS — O3506X0 Maternal care for (suspected) central nervous system malformation or damage in fetus, hydrocephaly, not applicable or unspecified: Secondary | ICD-10-CM

## 2018-11-16 ENCOUNTER — Encounter (HOSPITAL_COMMUNITY)
Admission: AD | Disposition: A | Discharge status: Home / Self Care | Payer: Self-pay | Source: Home / Self Care | Attending: Obstetrics and Gynecology

## 2018-11-16 ENCOUNTER — Inpatient Hospital Stay (HOSPITAL_COMMUNITY): Payer: Medicaid Other | Admitting: Anesthesiology

## 2018-11-16 ENCOUNTER — Other Ambulatory Visit: Payer: Self-pay

## 2018-11-16 ENCOUNTER — Inpatient Hospital Stay (HOSPITAL_COMMUNITY)
Admission: AD | Admit: 2018-11-16 | Discharge: 2018-11-19 | DRG: 788 | Disposition: A | Discharge status: Home / Self Care | Payer: Medicaid Other | Attending: Obstetrics and Gynecology | Admitting: Obstetrics and Gynecology

## 2018-11-16 ENCOUNTER — Inpatient Hospital Stay (HOSPITAL_COMMUNITY): Payer: Medicaid Other

## 2018-11-16 ENCOUNTER — Encounter (HOSPITAL_COMMUNITY): Payer: Self-pay

## 2018-11-16 DIAGNOSIS — O288 Other abnormal findings on antenatal screening of mother: Secondary | ICD-10-CM

## 2018-11-16 DIAGNOSIS — Z3A39 39 weeks gestation of pregnancy: Secondary | ICD-10-CM

## 2018-11-16 DIAGNOSIS — F419 Anxiety disorder, unspecified: Secondary | ICD-10-CM | POA: Diagnosis present

## 2018-11-16 DIAGNOSIS — O99344 Other mental disorders complicating childbirth: Secondary | ICD-10-CM | POA: Diagnosis present

## 2018-11-16 DIAGNOSIS — O4100X0 Oligohydramnios, unspecified trimester, not applicable or unspecified: Secondary | ICD-10-CM | POA: Diagnosis present

## 2018-11-16 DIAGNOSIS — O26893 Other specified pregnancy related conditions, third trimester: Secondary | ICD-10-CM

## 2018-11-16 DIAGNOSIS — O283 Abnormal ultrasonic finding on antenatal screening of mother: Secondary | ICD-10-CM | POA: Diagnosis not present

## 2018-11-16 DIAGNOSIS — O4103X0 Oligohydramnios, third trimester, not applicable or unspecified: Secondary | ICD-10-CM | POA: Diagnosis present

## 2018-11-16 DIAGNOSIS — F329 Major depressive disorder, single episode, unspecified: Secondary | ICD-10-CM | POA: Diagnosis present

## 2018-11-16 DIAGNOSIS — Z1159 Encounter for screening for other viral diseases: Secondary | ICD-10-CM

## 2018-11-16 DIAGNOSIS — Z98891 History of uterine scar from previous surgery: Secondary | ICD-10-CM

## 2018-11-16 LAB — CBC
HCT: 42.3 % (ref 36.0–46.0)
Hemoglobin: 13.8 g/dL (ref 12.0–15.0)
MCH: 29.9 pg (ref 26.0–34.0)
MCHC: 32.6 g/dL (ref 30.0–36.0)
MCV: 91.6 fL (ref 80.0–100.0)
Platelets: 185 10*3/uL (ref 150–400)
RBC: 4.62 MIL/uL (ref 3.87–5.11)
RDW: 13 % (ref 11.5–15.5)
WBC: 7.9 10*3/uL (ref 4.0–10.5)
nRBC: 0 % (ref 0.0–0.2)

## 2018-11-16 LAB — TYPE AND SCREEN
ABO/RH(D): O POS
Antibody Screen: NEGATIVE

## 2018-11-16 LAB — SARS CORONAVIRUS 2 BY RT PCR (HOSPITAL ORDER, PERFORMED IN ~~LOC~~ HOSPITAL LAB): SARS Coronavirus 2: NEGATIVE

## 2018-11-16 LAB — ABO/RH: ABO/RH(D): O POS

## 2018-11-16 LAB — RPR: RPR Ser Ql: NONREACTIVE

## 2018-11-16 SURGERY — Surgical Case
Anesthesia: Epidural

## 2018-11-16 MED ORDER — CEFAZOLIN SODIUM-DEXTROSE 2-4 GM/100ML-% IV SOLN
INTRAVENOUS | Status: AC
  Filled 2018-11-16: qty 100

## 2018-11-16 MED ORDER — PHENYLEPHRINE 40 MCG/ML (10ML) SYRINGE FOR IV PUSH (FOR BLOOD PRESSURE SUPPORT): 80.0000 ug | PREFILLED_SYRINGE | INTRAVENOUS | Status: DC | PRN

## 2018-11-16 MED ORDER — CEFAZOLIN SODIUM-DEXTROSE 2-4 GM/100ML-% IV SOLN
2.0000 g | Freq: Once | INTRAVENOUS | Status: AC
  Administered 2018-11-16: 2 g via INTRAVENOUS

## 2018-11-16 MED ORDER — EPHEDRINE 5 MG/ML INJ: 10.0000 mg | Freq: Once | INTRAVENOUS | Status: DC

## 2018-11-16 MED ORDER — PHENYLEPHRINE 40 MCG/ML (10ML) SYRINGE FOR IV PUSH (FOR BLOOD PRESSURE SUPPORT)
PREFILLED_SYRINGE | INTRAVENOUS | Status: AC
  Administered 2018-11-16: 80 ug via INTRAVENOUS
  Filled 2018-11-16: qty 10

## 2018-11-16 MED ORDER — LACTATED RINGERS IV SOLN
500.0000 mL | INTRAVENOUS | Status: DC | PRN
  Administered 2018-11-16 (×2): 500 mL via INTRAVENOUS

## 2018-11-16 MED ORDER — EPHEDRINE 5 MG/ML INJ: 10.0000 mg | INTRAVENOUS | Status: DC | PRN

## 2018-11-16 MED ORDER — LACTATED RINGERS IV SOLN: 500.0000 mL | Freq: Once | INTRAVENOUS | Status: DC

## 2018-11-16 MED ORDER — SODIUM CHLORIDE 0.9 % IV SOLN
500.0000 mg | Freq: Once | INTRAVENOUS | Status: AC
  Administered 2018-11-16: 500 mg via INTRAVENOUS

## 2018-11-16 MED ORDER — MEPERIDINE HCL 25 MG/ML IJ SOLN
INTRAMUSCULAR | Status: AC
  Filled 2018-11-16: qty 1

## 2018-11-16 MED ORDER — SODIUM CHLORIDE 0.9 % IV SOLN
INTRAVENOUS | Status: DC | PRN
  Administered 2018-11-16: via INTRAVENOUS

## 2018-11-16 MED ORDER — DEXAMETHASONE SODIUM PHOSPHATE 4 MG/ML IJ SOLN
INTRAMUSCULAR | Status: DC | PRN
  Administered 2018-11-16: 4 mg via INTRAVENOUS

## 2018-11-16 MED ORDER — PHENYLEPHRINE HCL (PRESSORS) 10 MG/ML IV SOLN
INTRAVENOUS | Status: DC | PRN
  Administered 2018-11-16 – 2018-11-17 (×5): 80 ug via INTRAVENOUS

## 2018-11-16 MED ORDER — MORPHINE SULFATE (PF) 0.5 MG/ML IJ SOLN
INTRAMUSCULAR | Status: AC
  Filled 2018-11-16: qty 10

## 2018-11-16 MED ORDER — PHENYLEPHRINE 40 MCG/ML (10ML) SYRINGE FOR IV PUSH (FOR BLOOD PRESSURE SUPPORT)
80.0000 ug | PREFILLED_SYRINGE | INTRAVENOUS | Status: DC | PRN
  Administered 2018-11-16: 80 ug via INTRAVENOUS

## 2018-11-16 MED ORDER — MISOPROSTOL 25 MCG QUARTER TABLET
25.0000 ug | ORAL_TABLET | ORAL | Status: DC
  Administered 2018-11-16 (×2): 25 ug via VAGINAL
  Filled 2018-11-16 (×2): qty 1

## 2018-11-16 MED ORDER — SODIUM CHLORIDE 0.9 % IV SOLN
INTRAVENOUS | Status: AC
  Filled 2018-11-16: qty 500

## 2018-11-16 MED ORDER — ONDANSETRON HCL 4 MG/2ML IJ SOLN: 4.0000 mg | Freq: Four times a day (QID) | INTRAMUSCULAR | Status: DC | PRN

## 2018-11-16 MED ORDER — LIDOCAINE HCL (PF) 1 % IJ SOLN: 30.0000 mL | INTRAMUSCULAR | Status: DC | PRN

## 2018-11-16 MED ORDER — BUPIVACAINE IN DEXTROSE 0.75-8.25 % IT SOLN: INTRATHECAL | Status: DC | PRN

## 2018-11-16 MED ORDER — ONDANSETRON HCL 4 MG/2ML IJ SOLN
INTRAMUSCULAR | Status: AC
  Filled 2018-11-16: qty 2

## 2018-11-16 MED ORDER — BUPIVACAINE HCL (PF) 0.25 % IJ SOLN: INTRAMUSCULAR | Status: DC | PRN

## 2018-11-16 MED ORDER — DIPHENHYDRAMINE HCL 50 MG/ML IJ SOLN
INTRAMUSCULAR | Status: AC
  Filled 2018-11-16: qty 1

## 2018-11-16 MED ORDER — DEXAMETHASONE SODIUM PHOSPHATE 4 MG/ML IJ SOLN
INTRAMUSCULAR | Status: AC
  Filled 2018-11-16: qty 1

## 2018-11-16 MED ORDER — FENTANYL CITRATE (PF) 100 MCG/2ML IJ SOLN
100.0000 ug | INTRAMUSCULAR | Status: DC | PRN
  Administered 2018-11-16: 100 ug via INTRAVENOUS

## 2018-11-16 MED ORDER — FENTANYL-BUPIVACAINE-NACL 0.5-0.125-0.9 MG/250ML-% EP SOLN: 12.0000 mL/h | EPIDURAL | Status: DC | PRN

## 2018-11-16 MED ORDER — LIDOCAINE-EPINEPHRINE (PF) 2 %-1:200000 IJ SOLN
INTRAMUSCULAR | Status: DC | PRN
  Administered 2018-11-16 (×2): 5 mL via EPIDURAL

## 2018-11-16 MED ORDER — LACTATED RINGERS IV SOLN
INTRAVENOUS | Status: DC
  Administered 2018-11-16 (×3): via INTRAVENOUS

## 2018-11-16 MED ORDER — METOCLOPRAMIDE HCL 5 MG/ML IJ SOLN
INTRAMUSCULAR | Status: AC
  Filled 2018-11-16: qty 2

## 2018-11-16 MED ORDER — BUPIVACAINE HCL (PF) 0.5 % IJ SOLN
INTRAMUSCULAR | Status: AC
  Filled 2018-11-16: qty 30

## 2018-11-16 MED ORDER — OXYTOCIN 40 UNITS IN NORMAL SALINE INFUSION - SIMPLE MED: 2.5000 [IU]/h | INTRAVENOUS | Status: DC

## 2018-11-16 MED ORDER — MISOPROSTOL 50MCG HALF TABLET
50.0000 ug | ORAL_TABLET | ORAL | Status: DC
  Administered 2018-11-16: 50 ug via BUCCAL
  Filled 2018-11-16: qty 1

## 2018-11-16 MED ORDER — FENTANYL CITRATE (PF) 100 MCG/2ML IJ SOLN
INTRAMUSCULAR | Status: AC
  Filled 2018-11-16: qty 2

## 2018-11-16 MED ORDER — SODIUM CHLORIDE (PF) 0.9 % IJ SOLN
INTRAMUSCULAR | Status: DC | PRN
  Administered 2018-11-16: 12 mL/h via EPIDURAL

## 2018-11-16 MED ORDER — SODIUM CHLORIDE 0.9 % IR SOLN
Status: DC | PRN
  Administered 2018-11-16: 1000 mL

## 2018-11-16 MED ORDER — OXYCODONE-ACETAMINOPHEN 5-325 MG PO TABS: 2.0000 | ORAL_TABLET | ORAL | Status: DC | PRN

## 2018-11-16 MED ORDER — OXYCODONE-ACETAMINOPHEN 5-325 MG PO TABS: 1.0000 | ORAL_TABLET | ORAL | Status: DC | PRN

## 2018-11-16 MED ORDER — FENTANYL-BUPIVACAINE-NACL 0.5-0.125-0.9 MG/250ML-% EP SOLN
EPIDURAL | Status: AC
  Filled 2018-11-16: qty 250

## 2018-11-16 MED ORDER — ONDANSETRON HCL 4 MG/2ML IJ SOLN
INTRAMUSCULAR | Status: DC | PRN
  Administered 2018-11-16: 4 mg via INTRAVENOUS

## 2018-11-16 MED ORDER — LIDOCAINE HCL (PF) 1 % IJ SOLN
INTRAMUSCULAR | Status: DC | PRN
  Administered 2018-11-16: 5 mL via EPIDURAL

## 2018-11-16 MED ORDER — SOD CITRATE-CITRIC ACID 500-334 MG/5ML PO SOLN
30.0000 mL | ORAL | Status: DC | PRN
  Administered 2018-11-16 (×2): 30 mL via ORAL
  Filled 2018-11-16 (×2): qty 30

## 2018-11-16 MED ORDER — ACETAMINOPHEN 325 MG PO TABS: 650.0000 mg | ORAL_TABLET | ORAL | Status: DC | PRN

## 2018-11-16 MED ORDER — OXYTOCIN BOLUS FROM INFUSION: 500.0000 mL | Freq: Once | INTRAVENOUS | Status: DC

## 2018-11-16 MED ORDER — DIPHENHYDRAMINE HCL 50 MG/ML IJ SOLN: 12.5000 mg | INTRAMUSCULAR | Status: DC | PRN

## 2018-11-16 MED ORDER — ONDANSETRON HCL 4 MG/2ML IJ SOLN: INTRAMUSCULAR | Status: DC | PRN

## 2018-11-16 MED ORDER — OXYTOCIN 40 UNITS IN NORMAL SALINE INFUSION - SIMPLE MED
INTRAVENOUS | Status: AC
  Filled 2018-11-16: qty 1000

## 2018-11-16 MED ORDER — SODIUM CHLORIDE 0.9 % IV SOLN
INTRAVENOUS | Status: DC | PRN
  Administered 2018-11-16: 40 [IU] via INTRAVENOUS

## 2018-11-16 SURGICAL SUPPLY — 38 items
BENZOIN TINCTURE PRP APPL 2/3 (GAUZE/BANDAGES/DRESSINGS) IMPLANT
CHLORAPREP W/TINT 26ML (MISCELLANEOUS) ×3 IMPLANT
CLAMP CORD UMBIL (MISCELLANEOUS) IMPLANT
CLOSURE WOUND 1/2 X4 (GAUZE/BANDAGES/DRESSINGS)
CLOTH BEACON ORANGE TIMEOUT ST (SAFETY) ×3 IMPLANT
DRSG OPSITE POSTOP 4X10 (GAUZE/BANDAGES/DRESSINGS) ×3 IMPLANT
ELECT REM PT RETURN 9FT ADLT (ELECTROSURGICAL) ×3
ELECTRODE REM PT RTRN 9FT ADLT (ELECTROSURGICAL) ×1 IMPLANT
EXTRACTOR VACUUM BELL STYLE (SUCTIONS) IMPLANT
GLOVE BIOGEL PI IND STRL 6.5 (GLOVE) ×1 IMPLANT
GLOVE BIOGEL PI IND STRL 7.0 (GLOVE) ×2 IMPLANT
GLOVE BIOGEL PI INDICATOR 6.5 (GLOVE) ×2
GLOVE BIOGEL PI INDICATOR 7.0 (GLOVE) ×4
GLOVE ORTHOPEDIC STR SZ6.5 (GLOVE) ×3 IMPLANT
GOWN STRL REUS W/TWL LRG LVL3 (GOWN DISPOSABLE) ×9 IMPLANT
HEMOSTAT ARISTA ABSORB 3G PWDR (HEMOSTASIS) ×2 IMPLANT
KIT ABG SYR 3ML LUER SLIP (SYRINGE) IMPLANT
NDL HYPO 25X1 1.5 SAFETY (NEEDLE) IMPLANT
NEEDLE HYPO 22GX1.5 SAFETY (NEEDLE) ×3 IMPLANT
NEEDLE HYPO 25X1 1.5 SAFETY (NEEDLE) IMPLANT
NS IRRIG 1000ML POUR BTL (IV SOLUTION) ×3 IMPLANT
PACK C SECTION WH (CUSTOM PROCEDURE TRAY) ×3 IMPLANT
PAD ABD 8X10 STRL (GAUZE/BANDAGES/DRESSINGS) ×4 IMPLANT
PAD OB MATERNITY 4.3X12.25 (PERSONAL CARE ITEMS) ×3 IMPLANT
PENCIL SMOKE EVAC W/HOLSTER (ELECTROSURGICAL) ×3 IMPLANT
SPONGE GAUZE 4X4 12PLY STER LF (GAUZE/BANDAGES/DRESSINGS) ×4 IMPLANT
STRIP CLOSURE SKIN 1/2X4 (GAUZE/BANDAGES/DRESSINGS) IMPLANT
SUT MON AB 4-0 PS1 27 (SUTURE) ×3 IMPLANT
SUT PLAIN 2 0 (SUTURE) ×2
SUT PLAIN ABS 2-0 CT1 27XMFL (SUTURE) ×1 IMPLANT
SUT VIC AB 0 CT1 36 (SUTURE) ×6 IMPLANT
SUT VIC AB 0 CTX 36 (SUTURE) ×2
SUT VIC AB 0 CTX36XBRD ANBCTRL (SUTURE) ×1 IMPLANT
SYR CONTROL 10ML LL (SYRINGE) ×3 IMPLANT
TAPE CLOTH SURG 6X10 WHT LF (GAUZE/BANDAGES/DRESSINGS) ×2 IMPLANT
TOWEL OR 17X24 6PK STRL BLUE (TOWEL DISPOSABLE) ×3 IMPLANT
TRAY FOLEY W/BAG SLVR 14FR LF (SET/KITS/TRAYS/PACK) ×3 IMPLANT
WATER STERILE IRR 1000ML POUR (IV SOLUTION) ×3 IMPLANT

## 2018-11-16 NOTE — Anesthesia Preprocedure Evaluation (Signed)
Anesthesia Evaluation  Patient identified by MRN, date of birth, ID band Patient awake    Reviewed: Allergy & Precautions, NPO status , Patient's Chart, lab work & pertinent test results  Airway Mallampati: II  TM Distance: >3 FB Neck ROM: Full    Dental no notable dental hx. (+) Teeth Intact   Pulmonary asthma ,    Pulmonary exam normal breath sounds clear to auscultation       Cardiovascular Exercise Tolerance: Good negative cardio ROS Normal cardiovascular exam Rhythm:Regular Rate:Normal     Neuro/Psych PSYCHIATRIC DISORDERS Anxiety    GI/Hepatic   Endo/Other    Renal/GU      Musculoskeletal   Abdominal   Peds  Hematology Hgb 13.8 Plt 185   Anesthesia Other Findings   Reproductive/Obstetrics (+) Pregnancy                             Anesthesia Physical Anesthesia Plan  ASA: II  Anesthesia Plan: Epidural   Post-op Pain Management:    Induction:   PONV Risk Score and Plan:   Airway Management Planned:   Additional Equipment:   Intra-op Plan:   Post-operative Plan:   Informed Consent: I have reviewed the patients History and Physical, chart, labs and discussed the procedure including the risks, benefits and alternatives for the proposed anesthesia with the patient or authorized representative who has indicated his/her understanding and acceptance.       Plan Discussed with:   Anesthesia Plan Comments:         Anesthesia Quick Evaluation

## 2018-11-16 NOTE — Progress Notes (Signed)
LABOR PROGRESS NOTE  Cheyenne Sutton is a 28 y.o. G1P0 at [redacted]w[redacted]d  admitted for IOL due to non-reactive NST.   Subjective: She is doing well resting in bed. She is experiencing minimal discomfort with contractions.  Objective: BP 107/68   Pulse 73   Temp 98.1 F (36.7 C) (Oral)   Resp 18   Ht 5\' 3"  (1.6 m)   Wt 83.5 kg   LMP 02/12/2018 (Exact Date)   SpO2 100%   BMI 32.59 kg/m  or  Vitals:   11/16/18 2010 11/16/18 2015 11/16/18 2020 11/16/18 2031  BP: 112/62 (!) 97/59 107/73 107/68  Pulse: 73 72 73 73  Resp: 18 18 18 18   Temp:      TempSrc:      SpO2: 100% 100% 100% 100%  Weight:      Height:        Dilation: 4 Effacement (%): 70 Cervical Position: Middle Station: -2 Presentation: Vertex Exam by:: Cheyenne Sutton, CMN FHT: 140 bpm baseline rate , minimal to moderate varibility, 10 x10 acels present, no decels Toco: 2-3 mins  Labs: Lab Results  Component Value Date   WBC 7.9 11/16/2018   HGB 13.8 11/16/2018   HCT 42.3 11/16/2018   MCV 91.6 11/16/2018   PLT 185 11/16/2018    Patient Active Problem List   Diagnosis Date Noted  . Abnormal fetal ultrasound 11/16/2018  . Oligohydramnios 11/16/2018    Assessment / Plan: 28 y.o. G1P0 at [redacted]w[redacted]d here for IOL due to non-reactive NST, AFI 5 cm.  Labor: SROM @2100  Light mec. Continue cervical exam for progression of labor. Consider Pitocin as labor progresses.  Fetal Wellbeing: Cat I Pain Control:  Epidural adequate Anticipated MOD: Vaginal  Jessen Siegman Autry-Lott, D.O. Family Medicine Resident, PGY-1 11/16/2018, 9:11 PM

## 2018-11-16 NOTE — MAU Provider Note (Addendum)
Chief Complaint:  Contractions and Vaginal Bleeding    HPI: Cheyenne Sutton is a 28 y.o. G1P0 at 9100w4dwho presents to maternity admissions reporting painful contractions, 10/10 since 4pm.  . She reports good fetal movement, denies LOF, vaginal bleeding, vaginal itching/burning, urinary symptoms, h/a, dizziness, n/v, diarrhea, constipation or fever/chills.    Past Medical History: Past Medical History:  Diagnosis Date  . Anxiety   . Asthma   . Depression   . Folliculitis   . Hernia   . Hydradenitis   . IBS (irritable bowel syndrome)     Past obstetric history: OB History  Gravida Para Term Preterm AB Living  1         0  SAB TAB Ectopic Multiple Live Births               # Outcome Date GA Lbr Len/2nd Weight Sex Delivery Anes PTL Lv  1 Current             Past Surgical History: Past Surgical History:  Procedure Laterality Date  . HERNIA REPAIR      Family History: No family history on file.  Social History: Social History   Tobacco Use  . Smoking status: Never Smoker  . Smokeless tobacco: Never Used  Substance Use Topics  . Alcohol use: Not Currently  . Drug use: Not Currently    Types: Marijuana    Comment: previous marijuana use-reports none since finding out about pregnancy    Allergies: No Known Allergies  Meds:  Medications Prior to Admission  Medication Sig Dispense Refill Last Dose  . CEPHALEXIN PO Take by mouth.     . drospirenone-ethinyl estradiol (YAZ,GIANVI,LORYNA) 3-0.02 MG tablet Take 1 tablet by mouth daily.     Marland Kitchen. FLUoxetine (PROZAC) 10 MG tablet Take 30 mg by mouth daily.     . Montelukast Sodium (SINGULAIR PO) Take by mouth.     . Prenatal Vit-Fe Fumarate-FA (PRENATAL VITAMIN PO) Take by mouth.       I have reviewed patient's Past Medical Hx, Surgical Hx, Family Hx, Social Hx, medications and allergies.   ROS:  Review of Systems  Constitutional: Negative for chills and fever.  Respiratory: Negative for shortness of breath.    Gastrointestinal: Positive for abdominal pain. Negative for diarrhea, nausea and vomiting.  Genitourinary: Positive for vaginal bleeding.   Other systems negative  Physical Exam   Patient Vitals for the past 24 hrs:  BP Temp Temp src Pulse Resp SpO2  11/16/18 0250 115/72 98 F (36.7 C) Oral 87 16 99 %  11/16/18 0249 114/71 99 F (37.2 C) - 88 17 99 %   Constitutional: Well-developed, well-nourished female in no acute distress.  Cardiovascular: normal rate and rhythm Respiratory: normal effort, clear to auscultation bilaterally GI: Abd soft, non-tender, gravid appropriate for gestational age.   No rebound or guarding. MS: Extremities nontender, no edema, normal ROM Neurologic: Alert and oriented x 4.  GU: Neg CVAT.  PELVIC EXAM: Dark red bloody discharge Dilation: 1(tight 1) Effacement (%): 40 Cervical Position: Middle Station: -3 Presentation: Vertex Exam by:: Latricia HeftAnna Cioce, RN  FHT:  Baseline 140 , moderate variability, accelerations absent, no decelerations Contractions:  Irregular     Labs: No results found for this or any previous visit (from the past 24 hour(s)).    Imaging:  US done AFI = 5 (<3%ile) BPP is 8/8  MAU Course/MDM: NST reviewed, nonreactive Patient is 23100w4d and has a persistently nonreactive fetal heart rate tracing.  Now has  new Oligohydramnios and a reassuring BPP score.   Bishop score is 3-4 Treatments in MAU included EFM.    Assessment: 1. Non-stress test nonreactive   2.     Single intrauterine pregnancy at [redacted]w[redacted]d 3.      Reassuring BPP score, 8/10 4.      New Oligohydramnios  Plan: Admit to Labor and Delivery Routine orders Induction of labor, foley, cytotec Labor team to follow   Hansel Feinstein CNM, MSN Certified Nurse-Midwife 11/16/2018 4:44 AM

## 2018-11-16 NOTE — Progress Notes (Signed)
Patient ID: Khristi Schiller, female   DOB: [birth year], 28 y.o.   MRN: [id number]  Has had a dose of cytotec; feels well; no complaints  BP 117/77, P 91 FHR 130s, 10x10accels, occ variables Ctx irreg, mild Cx post, soft 1/50/-3>unable to feel presenting part; bedside U/S>vtx  IUP@term  Oligo Nonreactive NST Cx unfavorable  Unable to place cervical foley; second cytotec given vaginally; plan to repeat cytotec in [place] 11/16/2018

## 2018-11-16 NOTE — Progress Notes (Signed)
FHT with increasing periods of minimal variability. Reviewed course, tracing with patient, recommendation to proceed with primary c-section for fetal intolerance remote from delivery. Patient and partner are agreeable.   The risks of cesarean section were discussed with the patient; including but not limited to: infection which may require antibiotics; bleeding which may require transfusion or re-operation; injury to bowel, bladder, ureters or other surrounding organs; need for additional procedures; placental abnormalities wth subsequent pregnancies, risk of needing c-sections in future pregnancies, incisional problems, thromboembolic phenomenon and other postoperative/anesthesia complications. Answered all questions. The patient verbalized understanding of the plan, giving informed consent for the procedure. She is agreeable to blood transfusion in the event of emergency.  OB anesthesia and OB OR aware.   Patient has been NPO, she will remain NPO for procedure Anesthesia and OR aware Preoperative prophylactic antibiotics and SCDs ordered on call to the OR  To OR when ready   K. Arvilla Meres, M.D. Attending Center for [employer] Fish farm manager)

## 2018-11-16 NOTE — Progress Notes (Signed)
Patient ID: Cheyenne Sutton, female   DOB: October 13, 1990, 28 y.o.   MRN: [id number]  In to room to introduce myself to pt and partner; s/p cytotec first dose at [phone]; feels well  BP 99/71, P 69 FHR 130s, +10x10 accels, no decels, occ mi variables Ctx irreg, mild Cx deferred (was 1/40 at cyto placement)  IUP@39 .4wks Oligohydramnios Non reactive NST Cx unfavorable  -Plan to repeat cytotec dose at noon as well as attempt cervical foley placement  Myrtis Ser [facility] 11/16/2018

## 2018-11-16 NOTE — Progress Notes (Signed)
Faculty Note  Reviewed tracing with L&D team, has until now been moderate variability with 10x10 accels and overall reassuring. Now with more minimal variability and no accels. Will give ephedrine as she is slightly hypotensive. Has had several IVF bolus. IF unchanged or progressively worsens, will discuss option of proceeding to c-section with patient.    Feliz Beam, M.D. Attending Center for [employer] Fish farm manager)

## 2018-11-16 NOTE — Consult Note (Signed)
Neonatology Note:   Attendance at C-section:    I was asked by Dr. Rosana Hoes to attend this primary C/S at term due to flat FHR tracing, oligohydramnios. The mother is a G1P0 O pos, GBS neg with anxiety/depression, on Prozac; IBS, Asthma, and a history of marijuana use, although none during pregnancy. ROM 3 hours before delivery, fluid with thin meconium. Infant vigorous with good spontaneous cry and tone. Delayed cord clamping was done. Needed only minimal bulb suctioning. Ap 9/9. Lungs clear to ausc in DR. Infant is able to remain with her mother for skin to skin time under nursing supervision. Transferred to the care of Pediatrician.   Real Cons, MD

## 2018-11-16 NOTE — MAU Note (Signed)
CTX since 1700, now 5-7 mins apart.  Also had brown discharge around 2100 that turned to bright red around 0100.   No gush of fluid.  + FM.

## 2018-11-16 NOTE — Progress Notes (Signed)
Patient ID: [name], female   DOB: Dec 29, 1990, 28 y.o.   MRN: [id number]  S/p cytotec vag x 2 doses- now time for another; Dr Rosana Hoes rev'd strip and agrees to proceed unless FHR declares otherwise  VSS, afeb FHR 130s, decreased variability, 10x10 accels, occ variables Ctx now q 2-4 mins Cx deferred  IUP@39 .4wks Oligo/nonreactive tracing Cx unfavorable  -Cytotec 90mcg buccal given -Plan for Fentanyl for discomfort -After 4hrs or sooner prn will check cx to determine plan- seems like she may be beginning to make cx change -If pt begins to have late decelerations or other ominous signs, will change plans accordingly  Myrtis Ser [facility] 11/16/2018 5:34 PM

## 2018-11-16 NOTE — H&P (Signed)
Chief Complaint: Contractions and Vaginal Bleeding  HPI: Cheyenne Sutton is a 28 y.o. G1P0 at 5639w4dwho presents to maternity admissions reporting painful contractions, 10/10 since 4pm. .  She reports good fetal movement, denies LOF, vaginal bleeding, vaginal itching/burning, urinary symptoms, h/a, dizziness, n/v, diarrhea, constipation or fever/chills.  Past Medical History:      Past Medical History:  Diagnosis Date  . Anxiety   . Asthma   . Depression   . Folliculitis   . Hernia   . Hydradenitis   . IBS (irritable bowel syndrome)   Past obstetric history:                  OB History  Gravida Para Term Preterm AB Living  1     0  SAB TAB Ectopic Multiple Live Births                # Outcome Date GA Lbr Len/2nd Weight Sex Delivery Anes PTL Lv  1 Current           Past Surgical History:       Past Surgical History:  Procedure Laterality Date  . HERNIA REPAIR    Family History:  No family history on file.  Social History:  Social History        Tobacco Use  . Smoking status: Never Smoker  . Smokeless tobacco: Never Used  Substance Use Topics  . Alcohol use: Not Currently  . Drug use: Not Currently    Types: Marijuana    Comment: previous marijuana use-reports none since finding out about pregnancy  Allergies: No Known Allergies  Meds:         Medications Prior to Admission  Medication Sig Dispense Refill Last Dose  . CEPHALEXIN PO Take by mouth.     . drospirenone-ethinyl estradiol (YAZ,GIANVI,LORYNA) 3-0.02 MG tablet Take 1 tablet by mouth daily.     Marland Kitchen. FLUoxetine (PROZAC) 10 MG tablet Take 30 mg by mouth daily.     . Montelukast Sodium (SINGULAIR PO) Take by mouth.     . Prenatal Vit-Fe Fumarate-FA (PRENATAL VITAMIN PO) Take by mouth.     I have reviewed patient's Past Medical Hx, Surgical Hx, Family Hx, Social Hx, medications and allergies.  ROS:  Review of Systems  Constitutional: Negative for chills and fever.  Respiratory: Negative for shortness of breath.   Gastrointestinal: Positive for abdominal pain. Negative for diarrhea, nausea and vomiting.  Genitourinary: Positive for vaginal bleeding.   Other systems negative  Physical Exam  Patient Vitals for the past 24 hrs:   BP Temp Temp src Pulse Resp SpO2  11/16/18 0250 115/72 98 F (36.7 C) Oral 87 16 99 %  11/16/18 0249 114/71 99 F (37.2 C) - 88 17 99 %  Constitutional: Well-developed, well-nourished female in no acute distress.  Cardiovascular: normal rate and rhythm  Respiratory: normal effort, clear to auscultation bilaterally  GI: Abd soft, non-tender, gravid appropriate for gestational age. No rebound or guarding.  MS: Extremities nontender, no edema, normal ROM  Neurologic: Alert and oriented x 4.  GU: Neg CVAT.  PELVIC EXAM: Dark red bloody discharge  Dilation: 1(tight 1)  Effacement (%): 40  Cervical Position: Middle  Station: -3  Presentation: Vertex  Exam by:: Latricia HeftAnna Cioce, RN  FHT: Baseline 140 , moderate variability, accelerations absent, no decelerations  Contractions: Irregular  Labs:  Lab Results Last 24 Hours    Imaging:  US done  AFI = 5 (<3%ile)  BPP is 8/8  MAU  Course/MDM:  NST reviewed, nonreactive  Patient is [redacted]w[redacted]d and has a persistently nonreactive fetal heart rate tracing. Now has new Oligohydramnios and a reassuring BPP score.  Bishop score is 3-4  Treatments in MAU included EFM.  Assessment:  1. Non-stress test nonreactive   2. Single intrauterine pregnancy at [redacted]w[redacted]d  3. Reassuring BPP score, 8/10  4. New Oligohydramnios  Plan:  Admit to Labor and Delivery  Routine orders  Induction of labor, foley, cytotec  Labor team to follow  Hansel Feinstein CNM, MSN  Certified Nurse-Midwife  11/16/2018  4:44 AM

## 2018-11-16 NOTE — Anesthesia Procedure Notes (Signed)
Epidural Patient location during procedure: OB Start time: 11/16/2018 7:37 PM End time: 11/16/2018 7:50 PM  Staffing Anesthesiologist: Barnet Glasgow, MD Performed: anesthesiologist   Preanesthetic Checklist Completed: patient identified, site marked, surgical consent, pre-op evaluation, timeout performed, IV checked, risks and benefits discussed and monitors and equipment checked  Epidural Patient position: sitting Prep: site prepped and draped and DuraPrep Patient monitoring: continuous pulse ox and blood pressure Approach: midline Location: L3-L4 Injection technique: LOR air  Needle:  Needle type: Tuohy  Needle gauge: 17 G Needle length: 9 cm and 9 Needle insertion depth: 7 cm Catheter type: closed end flexible Catheter size: 19 Gauge Catheter at skin depth: 12 cm Test dose: negative  Assessment Events: blood not aspirated, injection not painful, no injection resistance, negative IV test and no paresthesia  Additional Notes Patient identified. Risks/Benefits/Options discussed with patient including but not limited to bleeding, infection, nerve damage, paralysis, failed block, incomplete pain control, headache, blood pressure changes, nausea, vomiting, reactions to medication both or allergic, itching and postpartum back pain. Confirmed with bedside nurse the patient's most recent platelet count. Confirmed with patient that they are not currently taking any anticoagulation, have any bleeding history or any family history of bleeding disorders. Patient expressed understanding and wished to proceed. All questions were answered. Sterile technique was used throughout the entire procedure. Please see nursing notes for vital signs. Test dose was given through epidural needle and negative prior to continuing to dose epidural or start infusion. Warning signs of high block given to the patient including shortness of breath, tingling/numbness in hands, complete motor block, or any  concerning symptoms with instructions to call for help. Patient was given instructions on fall risk and not to get out of bed. All questions and concerns addressed with instructions to call with any issues. 1 Attempt (S) . Patient tolerated procedure well.

## 2018-11-16 NOTE — Progress Notes (Signed)
Faculty Note  Reviewed tracing with RN, patient here for IOL for non-reactive NST. S/p 2 x cytotec and failed foley attempt with 2nd cytotec.   FHT: 140s, moderate variability with periods of minimal, no 15x15 accels, occasional variable Toco: irregular contractions  A/P: 28 yo G1P0 @ [redacted]w[redacted]d admitted for IOL for non-reactive NST, AFI 5 cm. Overall, tracing reassuring even though non-reactive. Will continue induction and watch closely for signs of fetal decompensation.    Feliz Beam, M.D. Attending Center for [employer] (Faculty Practice) 11/16/18 2:55 PM

## 2018-11-17 DIAGNOSIS — Z98891 History of uterine scar from previous surgery: Secondary | ICD-10-CM

## 2018-11-17 DIAGNOSIS — O4103X0 Oligohydramnios, third trimester, not applicable or unspecified: Secondary | ICD-10-CM

## 2018-11-17 DIAGNOSIS — Z3A39 39 weeks gestation of pregnancy: Secondary | ICD-10-CM

## 2018-11-17 LAB — CBC
HCT: 36.3 % (ref 36.0–46.0)
Hemoglobin: 12 g/dL (ref 12.0–15.0)
MCH: 29.9 pg (ref 26.0–34.0)
MCHC: 33.1 g/dL (ref 30.0–36.0)
MCV: 90.5 fL (ref 80.0–100.0)
Platelets: 173 10*3/uL (ref 150–400)
RBC: 4.01 MIL/uL (ref 3.87–5.11)
RDW: 13 % (ref 11.5–15.5)
WBC: 11.4 10*3/uL — ABNORMAL HIGH (ref 4.0–10.5)
nRBC: 0 % (ref 0.0–0.2)

## 2018-11-17 LAB — CREATININE, SERUM
Creatinine, Ser: 0.8 mg/dL (ref 0.44–1.00)
GFR calc Af Amer: >60 mL/min (ref >60)
GFR calc non Af Amer: >60 mL/min (ref >60)

## 2018-11-17 MED ORDER — SIMETHICONE 80 MG PO CHEW
80.0000 mg | CHEWABLE_TABLET | ORAL | Status: DC
  Administered 2018-11-18 (×2): 80 mg via ORAL
  Filled 2018-11-17 (×2): qty 1

## 2018-11-17 MED ORDER — IBUPROFEN 800 MG PO TABS
800.0000 mg | ORAL_TABLET | Freq: Four times a day (QID) | ORAL | Status: DC
  Administered 2018-11-17 – 2018-11-19 (×8): 800 mg via ORAL
  Filled 2018-11-17 (×8): qty 1

## 2018-11-17 MED ORDER — ZOLPIDEM TARTRATE 5 MG PO TABS: 5.0000 mg | ORAL_TABLET | Freq: Every evening | ORAL | Status: DC | PRN

## 2018-11-17 MED ORDER — DIBUCAINE (PERIANAL) 1 % EX OINT: 1.0000 "application " | TOPICAL_OINTMENT | CUTANEOUS | Status: DC | PRN

## 2018-11-17 MED ORDER — LACTATED RINGERS IV SOLN
INTRAVENOUS | Status: DC | PRN
  Administered 2018-11-16: 23:00:00 via INTRAVENOUS

## 2018-11-17 MED ORDER — OXYCODONE-ACETAMINOPHEN 5-325 MG PO TABS: 1.0000 | ORAL_TABLET | ORAL | Status: DC | PRN

## 2018-11-17 MED ORDER — MEPERIDINE HCL 25 MG/ML IJ SOLN
INTRAMUSCULAR | Status: DC | PRN
  Administered 2018-11-16: 12.5 mg via INTRAVENOUS

## 2018-11-17 MED ORDER — SIMETHICONE 80 MG PO CHEW
80.0000 mg | CHEWABLE_TABLET | Freq: Three times a day (TID) | ORAL | Status: DC
  Administered 2018-11-17 – 2018-11-19 (×5): 80 mg via ORAL
  Filled 2018-11-17 (×6): qty 1

## 2018-11-17 MED ORDER — ONDANSETRON HCL 4 MG/2ML IJ SOLN: 4.0000 mg | Freq: Once | INTRAMUSCULAR | Status: DC | PRN

## 2018-11-17 MED ORDER — HYDROMORPHONE HCL 1 MG/ML IJ SOLN: 0.2500 mg | INTRAMUSCULAR | Status: DC | PRN

## 2018-11-17 MED ORDER — ENOXAPARIN SODIUM 40 MG/0.4ML ~~LOC~~ SOLN
40.0000 mg | SUBCUTANEOUS | Status: DC
  Administered 2018-11-18 – 2018-11-19 (×2): 40 mg via SUBCUTANEOUS
  Filled 2018-11-17 (×2): qty 0.4

## 2018-11-17 MED ORDER — OXYCODONE HCL 5 MG/5ML PO SOLN: 5.0000 mg | Freq: Once | ORAL | Status: DC | PRN

## 2018-11-17 MED ORDER — LACTATED RINGERS IV SOLN
INTRAVENOUS | Status: DC
  Administered 2018-11-17: 10:00:00 via INTRAVENOUS

## 2018-11-17 MED ORDER — MEASLES, MUMPS & RUBELLA VAC IJ SOLR: 0.5000 mL | Freq: Once | INTRAMUSCULAR | Status: DC

## 2018-11-17 MED ORDER — OXYTOCIN 40 UNITS IN NORMAL SALINE INFUSION - SIMPLE MED: 2.5000 [IU]/h | INTRAVENOUS | Status: AC

## 2018-11-17 MED ORDER — FENTANYL CITRATE (PF) 100 MCG/2ML IJ SOLN
INTRAMUSCULAR | Status: DC | PRN
  Administered 2018-11-16: 100 ug via INTRAVENOUS

## 2018-11-17 MED ORDER — SODIUM CHLORIDE (PF) 0.9 % IJ SOLN
INTRAMUSCULAR | Status: AC
  Filled 2018-11-17: qty 10

## 2018-11-17 MED ORDER — GABAPENTIN 100 MG PO CAPS
100.0000 mg | ORAL_CAPSULE | Freq: Two times a day (BID) | ORAL | Status: DC
  Administered 2018-11-17 – 2018-11-19 (×5): 100 mg via ORAL
  Filled 2018-11-17 (×5): qty 1

## 2018-11-17 MED ORDER — SIMETHICONE 80 MG PO CHEW: 80.0000 mg | CHEWABLE_TABLET | ORAL | Status: DC | PRN

## 2018-11-17 MED ORDER — SCOPOLAMINE 1 MG/3DAYS TD PT72
1.0000 | MEDICATED_PATCH | TRANSDERMAL | Status: DC
  Administered 2018-11-17: 1.5 mg via TRANSDERMAL
  Filled 2018-11-17: qty 1

## 2018-11-17 MED ORDER — SENNOSIDES-DOCUSATE SODIUM 8.6-50 MG PO TABS
2.0000 | ORAL_TABLET | ORAL | Status: DC
  Administered 2018-11-18 (×2): 2 via ORAL
  Filled 2018-11-17 (×2): qty 2

## 2018-11-17 MED ORDER — ACETAMINOPHEN 10 MG/ML IV SOLN
1000.0000 mg | Freq: Once | INTRAVENOUS | Status: DC | PRN
  Administered 2018-11-17: 1000 mg via INTRAVENOUS

## 2018-11-17 MED ORDER — MENTHOL 3 MG MT LOZG: 1.0000 | LOZENGE | OROMUCOSAL | Status: DC | PRN

## 2018-11-17 MED ORDER — METOCLOPRAMIDE HCL 5 MG/ML IJ SOLN
INTRAMUSCULAR | Status: DC | PRN
  Administered 2018-11-16: 5 mg via INTRAVENOUS

## 2018-11-17 MED ORDER — DIPHENHYDRAMINE HCL 25 MG PO CAPS: 25.0000 mg | ORAL_CAPSULE | Freq: Four times a day (QID) | ORAL | Status: DC | PRN

## 2018-11-17 MED ORDER — ACETAMINOPHEN 10 MG/ML IV SOLN
INTRAVENOUS | Status: AC
  Filled 2018-11-17: qty 100

## 2018-11-17 MED ORDER — ONDANSETRON HCL 4 MG PO TABS
4.0000 mg | ORAL_TABLET | Freq: Three times a day (TID) | ORAL | Status: DC | PRN
  Administered 2018-11-17: 4 mg via ORAL
  Filled 2018-11-17 (×2): qty 1

## 2018-11-17 MED ORDER — WITCH HAZEL-GLYCERIN EX PADS: 1.0000 "application " | MEDICATED_PAD | CUTANEOUS | Status: DC | PRN

## 2018-11-17 MED ORDER — KETOROLAC TROMETHAMINE 30 MG/ML IJ SOLN
30.0000 mg | Freq: Four times a day (QID) | INTRAMUSCULAR | Status: AC
  Administered 2018-11-17 (×2): 30 mg via INTRAVENOUS
  Filled 2018-11-17 (×2): qty 1

## 2018-11-17 MED ORDER — COCONUT OIL OIL
1.0000 "application " | TOPICAL_OIL | Status: DC | PRN
  Administered 2018-11-18: 1 via TOPICAL

## 2018-11-17 MED ORDER — OXYCODONE HCL 5 MG PO TABS: 5.0000 mg | ORAL_TABLET | Freq: Once | ORAL | Status: DC | PRN

## 2018-11-17 MED ORDER — TETANUS-DIPHTH-ACELL PERTUSSIS 5-2.5-18.5 LF-MCG/0.5 IM SUSP: 0.5000 mL | Freq: Once | INTRAMUSCULAR | Status: DC

## 2018-11-17 MED ORDER — BUPIVACAINE HCL (PF) 0.5 % IJ SOLN
INTRAMUSCULAR | Status: DC | PRN
  Administered 2018-11-17: 30 mL

## 2018-11-17 MED ORDER — MORPHINE SULFATE (PF) 0.5 MG/ML IJ SOLN
INTRAMUSCULAR | Status: DC | PRN
  Administered 2018-11-16: 1 mg via INTRAVENOUS
  Administered 2018-11-16: 3 mg via EPIDURAL

## 2018-11-17 MED ORDER — PRENATAL MULTIVITAMIN CH
1.0000 | ORAL_TABLET | Freq: Every day | ORAL | Status: DC
  Administered 2018-11-17 – 2018-11-19 (×3): 1 via ORAL
  Filled 2018-11-17 (×3): qty 1

## 2018-11-17 MED ORDER — DIPHENHYDRAMINE HCL 50 MG/ML IJ SOLN
INTRAMUSCULAR | Status: DC | PRN
  Administered 2018-11-16: 12.5 mg via INTRAVENOUS

## 2018-11-17 NOTE — Op Note (Addendum)
Cesarean Section Operative Report  PATIENT: Cheyenne NeighborSage Tango  PROCEDURE DATE: 11/17/2018  PREOPERATIVE DIAGNOSES: Intrauterine pregnancy at 3348w5d weeks gestation; non-reassuring fetal status  POSTOPERATIVE DIAGNOSES: The same  PROCEDURE: Primary Low Transverse Cesarean Section  SURGEON:   Surgeon(s) and Role:    * Conan Bowensavis, Kelly M, MD - Primary    * Arvilla MarketWallace, Lakendria Nicastro Lauren, DO - Fellow   An experienced assistant was required given the standard of surgical care given the complexity of the case.  This assistant was needed for exposure, dissection, suctioning, retraction, instrument exchange, assisting with delivery with administration of fundal pressure, and for overall help during the procedure.    INDICATIONS: Cheyenne Sutton is a 28 y.o. G1P0 at 6248w5d here for cesarean section secondary to the indications listed under preoperative diagnoses; please see preoperative note for further details.  The risks of cesarean section were discussed with the patient including but were not limited to: bleeding which may require transfusion or reoperation; infection which may require antibiotics; injury to bowel, bladder, ureters or other surrounding organs; injury to the fetus; need for additional procedures including hysterectomy in the event of a life-threatening hemorrhage; placental abnormalities wth subsequent pregnancies, incisional problems, thromboembolic phenomenon and other postoperative/anesthesia complications.   The patient concurred with the proposed plan, giving informed written consent for the procedure.    FINDINGS:  Viable female infant in cephalic direct OP presentation.  Apgars 9 and 9.  Moderate meconium stained amniotic fluid.  Intact placenta, three vessel cord.  Normal uterus, fallopian tubes and ovaries bilaterally.  ANESTHESIA: Epidural INTRAVENOUS FLUIDS: 1200 mL  ESTIMATED BLOOD LOSS: 207 mL URINE OUTPUT:  1100 ml SPECIMENS: Placenta sent to pathology COMPLICATIONS: None  immediate  PROCEDURE IN DETAIL:  The patient preoperatively received intravenous antibiotics and had sequential compression devices applied to her lower extremities.  She was then taken to the operating room where the epidural anesthesia was dosed up to surgical level and was found to be adequate. She was then placed in a dorsal supine position with a leftward tilt, and prepped and draped in a sterile manner.  A foley catheter was placed into her bladder and attached to constant gravity.    After an adequate timeout was performed, a Pfannenstiel skin incision was made with scalpel and carried through to the underlying layer of fascia. The fascia was incised in the midline, and this incision was extended bilaterally using the Mayo scissors.  Kocher clamps were applied to the superior aspect of the fascial incision and the underlying rectus muscles were dissected off bluntly.  A similar process was carried out on the inferior aspect of the fascial incision. The rectus muscles were separated in the midline bluntly and the peritoneum was entered bluntly. Attention was turned to the lower uterine segment where a low transverse hysterotomy was made with a scalpel and extended bilaterally bluntly.  The infant was successfully delivered, the cord was clamped and cut after one minute, and the infant was handed over to the awaiting neonatology team. Uterine massage was then administered, and the placenta delivered intact with a three-vessel cord. The uterus was then cleared of clots and debris.  The hysterotomy was closed with 0 Vicryl in a running locked fashion, and an imbricating layer was also placed with 0 Vicryl. The pelvis was cleared of all clot and debris. Hemostasis was confirmed on all surfaces.  The peritoneum was closed with a 0 Vicryl running stitch. This stitch was then removed due to concern for potential bowel incarceration as bowel  was relaxing through the peritoneal closure. The fascia was then closed  using 0 Vicryl.  The subcutaneous layer was irrigated, then reapproximated with 2-0 plain gut running stitches, and 30 ml of 0.5% Marcaine was injected subcutaneously around the incision.  The skin was closed with a 4-0 Vicryl subcuticular stitch.   The patient tolerated the procedure well. Sponge, lap, instrument and needle counts were correct x 3.  She was taken to the recovery room in stable condition.   An experienced assistant was required given the standard of surgical care given the complexity of the case.  This assistant was needed for exposure, dissection, suctioning, retraction, instrument exchange, assisting with delivery with administration of fundal pressure, and for overall help during the procedure.   Maternal Disposition: PACU - hemodynamically stable.   Infant Disposition: stable   Phill Myron, D.O. OB Fellow  11/17/2018, 12:30 AM

## 2018-11-17 NOTE — Discharge Summary (Signed)
OB Discharge Summary     Patient Name: Cheyenne Sutton DOB: 1990-08-03 MRN: [id number]  Date of admission: 11/16/2018 Delivering MD: Conan BowensAVIS, KELLY M   Date of discharge: 11/19/2018  Admitting diagnosis: 39WKS CTX, BLEEDING Intrauterine pregnancy: 267w5d     Secondary diagnosis:  Active Problems:   Abnormal fetal ultrasound   Oligohydramnios   Status post primary low transverse cesarean section  Additional problems: None      Discharge diagnosis: Term Pregnancy Delivered                                                                                                Post partum procedures:None  Augmentation: Cytotec  Complications: None  Hospital course:  Onset of Labor With Unplanned C/S  28 y.o. yo G1P0 at 7167w5d was admitted in Latent Labor on 11/16/2018. Patient had a labor course significant for NRFHT during induction; remote from delivery. Membrane Rupture Time/Date: 9:00 PM ,11/16/2018   The patient went for cesarean section due to Non-Reassuring FHR, and delivered a Viable infant,11/16/2018  Details of operation can be found in separate operative note. Patient had an uncomplicated postpartum course.  She is ambulating,tolerating a regular diet, passing flatus, and urinating well.  Patient is discharged home in stable condition 11/19/18.  Physical exam  Vitals:   11/18/18 0515 11/18/18 1502 11/18/18 2233 11/19/18 0602  BP: 103/65 105/74 101/63 111/83  Pulse: 68 77 71 66  Resp: 16 16 16 16   Temp: 98.3 F (36.8 C) 98.7 F (37.1 C) 98 F (36.7 C) 98.8 F (37.1 C)  TempSrc:  Oral Oral Oral  SpO2: 100%  100%   Weight:      Height:       General: alert, cooperative and no distress  Chest: Lungs CTA, Heart RRR Abdomen: Soft, Appropriately Tender, BSx4 Breast: Filling, Nipples Red with some cracking on left side Lochia: appropriate Uterine Fundus: firm Incision: N/A DVT Evaluation: No evidence of DVT seen on physical exam. No cords or calf tenderness. Labs: Lab  Results  Component Value Date   WBC 11.4 (H) 11/17/2018   HGB 12.0 11/17/2018   HCT 36.3 11/17/2018   MCV 90.5 11/17/2018   PLT 173 11/17/2018   CMP Latest Ref Rng & Units 11/17/2018  Glucose 70 - 99 mg/dL -  BUN 6 - 20 mg/dL -  Creatinine 4.090.44 - 8.111.00 mg/dL 9.140.80  Sodium 782135 - 956145 mmol/L -  Potassium 3.5 - 5.1 mmol/L -  Chloride 98 - 111 mmol/L -  CO2 22 - 32 mmol/L -  Calcium 8.9 - 10.3 mg/dL -  Total Protein 6.5 - 8.1 g/dL -  Total Bilirubin 0.3 - 1.2 mg/dL -  Alkaline Phos 38 - 213126 U/L -  AST 15 - 41 U/L -  ALT 0 - 44 U/L -    Discharge instruction: per After Visit Summary and "Baby and Me Booklet". Remove steri-strips in 7-10 days.  Incision care guidelines: how to clean, when to call, and anticipated healing.  Pain Management, Peri-Care, Breastfeeding, Who and When to call for postpartum complications. Information Sheet(s) given Perinatal Depression, Care after C/S  After visit meds:   Allergies as of 11/19/2018   No Known Allergies     Medication List    STOP taking these medications   cephALEXin 500 MG capsule Commonly known as: KEFLEX     TAKE these medications   albuterol 108 (90 Base) MCG/ACT inhaler Commonly known as: VENTOLIN HFA Inhale 1-2 puffs into the lungs every 6 (six) hours as needed for wheezing or shortness of breath.   ibuprofen 800 MG tablet Commonly known as: ADVIL Take 1 tablet (800 mg total) by mouth every 8 (eight) hours as needed.   montelukast 10 MG tablet Commonly known as: SINGULAIR Take 10 mg by mouth at bedtime.   prenatal multivitamin Tabs tablet Take 1 tablet by mouth daily at 12 noon.   senna-docusate 8.6-50 MG tablet Commonly known as: Senokot-S Take 2 tablets by mouth at bedtime.   sertraline 50 MG tablet Commonly known as: ZOLOFT Take 50 mg by mouth at bedtime.        Diet: routine diet  Activity: Advance as tolerated. Pelvic rest for 6 weeks.   Outpatient follow up:4 weeks Follow up Appt:No future  appointments. Follow up Visit:No follow-ups on file.  Postpartum contraception: IUD Mirena  Newborn Data: Live born female-Teagan Birth Weight:  6lbs 0.5 oz  APGAR: 34, 9  Newborn Delivery   Birth date/time: 11/16/2018 23:41:00 Delivery type: C-Section, Low Transverse Trial of labor: Yes C-section categorization: Primary      Baby Feeding: Breast Disposition:home with mother   11/19/2018 Maryann Conners, CNM

## 2018-11-17 NOTE — Lactation Note (Signed)
This note was copied from a baby's chart. Lactation Consultation Note  Patient Name: Cheyenne Sutton [id number] Date: 11/17/2018 Reason for consult: Initial assessment;Primapara;1st time breastfeeding;Term  61 hours old FT female who is being exclusively BF by her mother, she's a P1. Mom has a Hx of THC use during the pregnancy but UDS was (-). She reported moderate breast changes during the pregnancy, she denied any Hx of PCOS but noticed that mom has hirsutism. She participated in the [facility] program at the [facility] and has a [place] at home. RN Jiles Garter has been very proactive and already set mom up with a DEBP, instructions, cleaning and storage were reviewed as mom started her pumping sessions during [facility] consultation.  Baby was asleep when entering the room and not ready to feed, but mom said she'll call her RN for assistance when needed. Baby has been having challenges latching but mom is willing to work on BF. LC showed parents how to finger feed baby; parents aware that baby needs to get all the EBM that is available before any further supplementation; baby's weight will most likely drop under 6 lbs tomorrow. Reviewed normal newborn behavior, feeding cues and pumping schedule.  Feeding plan:  1. Encouraged mom to feed baby STS 8-12 times/24 hours or sooner if feeding cues are present 2. She'll also pump every 3 hours after feedings and will offer any amount of EBM she may get; parents understand this is critical to offset the weight loss  BF brochure, BF resources and feeding diary were reviewed. Parents reported all questions and concerns were answered, they're both aware of [facility] OP services and will call PRN.  Maternal Data Formula Feeding for Exclusion: No Has patient been taught Hand Expression?: Yes Does the patient have breastfeeding experience prior to this delivery?: No  Feeding Feeding Type: Breast Fed   Interventions Interventions: Breast feeding basics reviewed;DEBP  Lactation  Tools Discussed/Used Tools: Pump Breast pump type: Double-Electric Breast Pump WIC Program: Yes Pump Review: Setup, frequency, and cleaning Initiated by:: RN Jiles Garter Date initiated:: 11/17/18   Consult Status Consult Status: Follow-up Date: 11/18/18 Follow-up type: In-patient    Lisia [place] Francene Boyers 11/17/2018, 6:54 PM

## 2018-11-17 NOTE — Clinical Social Work Maternal (Signed)
CLINICAL SOCIAL WORK MATERNAL/CHILD NOTE  Patient Details  Name: Cheyenne Sutton MRN: [id number] Date of Birth: 02/23/1991  Date:  11/17/2018  Clinical Social Worker Initiating Note:  Mattalynn Crandle Date/Time: Initiated:  11/17/18/1108     Child's Name:  Teagan Pegram   Biological Parents:  Mother, Father(Owen Narang and Torian Pegram DOB: 01/11/1995)   Need for Interpreter:  None   Reason for Referral:  Current Substance Use/Substance Use During Pregnancy    Address:  [address] [place] [address]    Phone number:  [phone] (home)     Additional phone number:   Household Members/Support Persons (HM/SP):   Household Member/Support Person 1   HM/SP Name Relationship DOB or Age  HM/SP -1 Torian Pegram FOB 01/11/1995  HM/SP -2        HM/SP -3        HM/SP -4        HM/SP -5        HM/SP -6        HM/SP -7        HM/SP -8          Natural Supports (not living in the home):  Parent, Friends   Professional Supports: Other (Comment)(MOB reported she currently has a nurse through [facility])   Employment: Part-time   Type of Work: [employer] and [employer]   Education:  College graduate   Homebound arranged:    Financial Resources:  Medicaid   Other Resources:  WIC   Cultural/Religious Considerations Which May Impact Care:    Strengths:  Ability to meet basic needs , Home prepared for child , Pediatrician chosen   Psychotropic Medications:         Pediatrician:    [place] area  Pediatrician List:   [facility]  [place]    [place]    [place]    [place]    [place]      Pediatrician Fax Number:    Risk Factors/Current Problems:      Cognitive State:  Alert , Able to Concentrate , Linear Thinking    Mood/Affect:  Calm , Comfortable , Interested , Happy , Relaxed    CSW Assessment:  CSW received consult for history  of anxiety and depression and THC use during pregnancy. CSW met with MOB to offer support and complete assessment.    MOB sitting up in bed filling out paperwork with infant asleep at bedside, when CSW entered the room. FOB also present but left prior to CSW completing assessment. CSW introduced self and explained reason for consult to which MOB expressed understanding. MOB pleasant and easy to engage throughout assessment. MOB reported she currently lives with FOB and that she works part-time at [employer] and [employer]. MOB stated she currently receives WIC and plans to follow up with food stamps to see if she qualifies. MOB reported she is aware of process to get infant added on to her plan.  CSW inquired about MOB's mental health history and MOB acknowledged having a history of anxiety and depression diagnosed in 2015. MOB reported she experienced anxiety and depression during her pregnancy and was started on Zoloft last Friday. MOB stated she has already noticed changes and voiced wanting to start medications because she knows she is at high-risk for PPD. CSW provided education regarding the baby blues period vs. perinatal mood disorders, discussed treatment and gave resources for mental health follow up if   concerns arise.  CSW recommends self-evaluation during the postpartum time period using the New Mom Checklist from Postpartum Progress and encouraged MOB to contact a medical professional if symptoms are noted at any time. MOB denied any current SI, HI or DV and reported having good support from FOB, her mother and her friends.   CSW inquired about MOB's substance use history and MOB openly shared using THC during her pregnancy for anxiety but reported last use was in May. CSW informed MOB of Hospital Drug Policy and explained UDS and CDS were still pending but that a CPS report would be made, if warranted. CSW explained what process of report may look like and MOB denied any  questions or concerns regarding policy and report. MOB confirmed having all essential items for infant once discharged and reported infant would be sleeping in a crib once home. CSW provided review of Sudden Infant Death Syndrome (SIDS) precautions and safe sleeping habits.     CSW Plan/Description:  No Further Intervention Required/No Barriers to Discharge, Sudden Infant Death Syndrome (SIDS) Education, Perinatal Mood and Anxiety Disorder (PMADs) Education, Hospital Drug Screen Policy Information, CSW Will Continue to Monitor Umbilical Cord Tissue Drug Screen Results and Make Report if Warranted, Other Information/Referral to Community Resources    Ziyan Schoon, LCSWA 11/17/2018, 11:39 AM  

## 2018-11-17 NOTE — Progress Notes (Addendum)
Faculty Attending Note  Post Op Day 1  Subjective: Patient is feeling okay, she is sore. She reports moderately well controlled pain on PO pain meds. She is not ambulating yet, denies light-headedness or dizziness. She has foley catheter in place. She is not passing flatus, has not had a BM yet. She is tolerating a small amounts of regular diet without nausea/vomiting. Bleeding is moderate. She is breast feeding. Baby is in nursery and doing well.  Objective: Blood pressure 110/85, pulse 96, temperature 98.8 F (37.1 C), temperature source Oral, resp. rate 16, height 5\' 3"  (1.6 m), weight 83.5 kg, last menstrual period 02/12/2018, SpO2 97 %. Temp:  [96.9 F (36.1 C)-99.7 F (37.6 C)] 98.8 F (37.1 C) (07/18 0535) Pulse Rate:  [69-109] 96 (07/18 0535) Resp:  [16-20] 16 (07/18 0535) BP: (95-126)/(54-92) 110/85 (07/18 0535) SpO2:  [96 %-100 %] 97 % (07/18 0535)  Physical Exam:  General: alert, oriented, cooperative Chest: normal respiratory effort Heart: RRR  Abdomen: soft, appropriately tender to palpation, incision covered by dressing with no evidence of active bleeding  Uterine Fundus: firm, 2 fingers below the umbilicus Lochia: moderate, rubra DVT Evaluation: no evidence of DVT Extremities: no edema, no calf tenderness  UOP: >100 mL/hr clear yellow urine  Recent Labs    11/16/18 0609 11/17/18 0411  HGB 13.8 12.0  HCT 42.3 36.3    Assessment/Plan: Patient Active Problem List   Diagnosis Date Noted  . Status post primary low transverse cesarean section 11/17/2018  . Abnormal fetal ultrasound 11/16/2018  . Oligohydramnios 11/16/2018    Patient is 28 y.o. G1P0 POD#1 s/p 1LTCS at [redacted]w[redacted]d for non-reassuring fetal status. She is doing very well, recovering appropriately and complains only of some soreness and pain. Foley to come out today, advance diet as tolerated. OOB today.  Continue routine post partum care Pain meds prn Regular diet Lovenox 40 mg daily [place] undecided  for birth control Plan for discharge Sun/Mon    Feliz Beam, M.D. Attending Center for [employer] (Faculty Practice)  11/17/2018, 8:41 AM

## 2018-11-17 NOTE — Anesthesia Postprocedure Evaluation (Signed)
Anesthesia Post Note  Patient: Cheyenne Sutton  Procedure(s) Performed: CESAREAN SECTION (N/A )     Patient location during evaluation: Mother Baby Anesthesia Type: Epidural Level of consciousness: oriented and awake and alert Pain management: pain level controlled Vital Signs Assessment: post-procedure vital signs reviewed and stable Respiratory status: spontaneous breathing and respiratory function stable Cardiovascular status: blood pressure returned to baseline and stable Postop Assessment: no headache, no backache, no apparent nausea or vomiting and able to ambulate Anesthetic complications: no    Last Vitals:  Vitals:   11/17/18 0038 11/17/18 0045  BP: 100/88 (!) 116/92  Pulse: 100 97  Resp:  18  Temp: 36.7 C   SpO2: 100% 100%    Last Pain:  Vitals:   11/17/18 0100  TempSrc:   PainSc: (P) 0-No pain   Pain Goal:    LLE Motor Response: Non-purposeful movement (11/17/18 0045) LLE Sensation: Tingling (11/17/18 0045) RLE Motor Response: Non-purposeful movement (11/17/18 0045) RLE Sensation: Tingling (11/17/18 0045)     Epidural/Spinal Function Cutaneous sensation: Able to Discern Pressure (11/17/18 0045), Patient able to flex knees: No (11/17/18 0045), Patient able to lift hips off bed: No (11/17/18 0045), Back pain beyond tenderness at insertion site: No (11/17/18 0045), Progressively worsening motor and/or sensory loss: No (11/17/18 0045), Bowel and/or bladder incontinence post epidural: No (11/17/18 0045)  Barnet Glasgow

## 2018-11-18 ENCOUNTER — Encounter (HOSPITAL_COMMUNITY): Payer: Self-pay | Admitting: Obstetrics and Gynecology

## 2018-11-18 NOTE — Progress Notes (Signed)
Cheyenne Sutton [id number] Postpartum Day Two S/P Primary  Cesarean Section due to NRFHT  Subjective: Patient up ad lib, denies syncope or dizziness. Reports consuming regular diet without issues and denies N/V. Patient reports that she has not had a bowel movement, but is passing flatus.  Denies issues with urination and reports bleeding is "pretty normal."  Patient is breastfeeding and reports some difficulty with infant fussiness.  Desires Mirena for postpartum contraception.  Pain is being appropriately managed with use of Ibuprofen.   Objective: Temp:  [98.3 F (36.8 C)-98.8 F (37.1 C)] 98.3 F (36.8 C) (07/19 0515) Pulse Rate:  [68-75] 68 (07/19 0515) Resp:  [16-18] 16 (07/19 0515) BP: (103-110)/(65-78) 103/65 (07/19 0515) SpO2:  [98 %-100 %] 100 % (07/19 0515)  Recent Labs    11/16/18 0609 11/17/18 0411  HGB 13.8 12.0  HCT 42.3 36.3  WBC 7.9 11.4*    Physical Exam:  General: alert, cooperative and no distress Mood/Affect: Appropriate/Appropriate Lungs: clear to auscultation, no wheezes, rales or rhonchi, symmetric air entry.  Heart: normal rate and regular rhythm. Breast: breasts appear normal, no suspicious masses, no skin or nipple changes. Filling Abdomen:  + bowel sounds, Appropriately Tender,  Incision: Pressure dressing removed, Honeycomb dressing CDI Uterine Fundus: firm below umbilicus2 Lochia: appropriate Skin: Warm, Dry. DVT Evaluation: No evidence of DVT seen on physical exam. No significant calf/ankle edema. JP drain:   None  Assessment Post Operative Day 2 S/P Primary C/S Normal Involution BreastFeeding Hemodynamically Stable  Plan: -Encouraged continued ambulation to promote increased bowel function -Discussed Mirena contraception placement at [facility] during PPV. -Instructed to utilize lactation as appropriate -Plan for discharge tomorrow -Continue other mgmt as ordered  Maryann Conners MSN, CNM 11/18/2018, 9:56 AM

## 2018-11-18 NOTE — Transfer of Care (Signed)
Immediate Anesthesia Transfer of Care Note  Patient: Cheyenne Sutton  Procedure(s) Performed: CESAREAN SECTION (N/A )  Patient Location: PACU  Anesthesia Type:Epidural  Level of Consciousness: awake, alert  and oriented  Airway & Oxygen Therapy: Patient Spontanous Breathing  Post-op Assessment: Report given to RN and Post -op Vital signs reviewed and stable  Post vital signs: Reviewed and stable  Last Vitals:  Vitals Value Taken Time  BP 105/74 11/18/18 1502  Temp 37.1 C 11/18/18 1502  Pulse 77 11/18/18 1502  Resp 16 11/18/18 1502  SpO2 100 % 11/18/18 0515    Last Pain:  Vitals:   11/18/18 1717  TempSrc:   PainSc: 0-No pain         Complications: No apparent anesthesia complications

## 2018-11-18 NOTE — Progress Notes (Signed)
POSTPARTUM PROGRESS NOTE  POD #2  Subjective:  Cheyenne Sutton is a 28 y.o. G1P0 s/p pLTCS at [redacted]w[redacted]d.  She reports she doing well. No acute events overnight. She reports she is doing well. She denies any problems with ambulating, voiding or po intake. Denies nausea or vomiting. She has passed flatus. Pain is moderately controlled.  Lochia is decreasing and apporpriate.  Objective: Blood pressure 103/65, pulse 68, temperature 98.3 F (36.8 C), resp. rate 16, height 5\' 3"  (1.6 m), weight 83.5 kg, last menstrual period 02/12/2018, SpO2 100 %.  Physical Exam:  General: alert, cooperative and no distress Chest: no respiratory distress Heart: distal pulses intact Abdomen: soft, nontender Uterine Fundus: firm, appropriately tender DVT Evaluation: No calf swelling or tenderness Extremities: No edema Skin: warm, dry; incision clean/dry/intact w/ honeycomb dressing in place  Recent Labs    11/16/18 0609 11/17/18 0411  HGB 13.8 12.0  HCT 42.3 36.3    Assessment/Plan: Cheyenne Sutton is a 28 y.o. G1P0 s/p pLTCS at [redacted]w[redacted]d for NRFHT.  POD#2 - Doing well; pain moderately controlled. H/H appropriate  Routine postpartum care  OOB, ambulated  Lovenox for VTE prophylaxis Anemia: H/H WNL Contraception: IUD outpt Feeding: Breast  Dispo: Plan for discharge 11/19/18.   LOS: 2 days   Kadance Mccuistion Autry-Lott D.O. Family Medicine Resident, PGY-1 11/18/2018, 7:15 AM

## 2018-11-18 NOTE — Lactation Note (Signed)
This note was copied from a baby's chart. Lactation Consultation Note  Patient Name: Cheyenne Sutton [id number] Date: 11/18/2018 Reason for consult: Follow-up assessment;Term;Primapara;1st time breastfeeding  P1 mother whose infant is now 54 hours old.  Baby was starting to arouse when I arrived. Offered to assist with latching and mother accepted.    Mother's breasts are large, soft and non tender and nipples are short shafted and intact.  Reviewed hand expression and mother was able to obtain colostrum drops which I finger fed back to baby.  Assisted to latch to the left breast in the football hold without difficulty.  Baby would suck 2-3 times and become fussy.  Continued to calm her and showed mother how to calm her so she could continue sucking.  Repeatedly she would suck for 3-4 bursts and become restless and irritated.  Mother has been supplementing with Jerlyn Ly Start.  Continued to latch baby to the breast while using curved tip syringe with formula at the breast to entice her to continue to suck.  This seemed to satisfy her to continue sucking for 12 minutes.  Mother will continue to feed 8-12 times/24 hours or sooner if baby shows feeding cues.  She will continue to post pump for 15 minutes and feed back any EBM she obtains with pumping.  Encouraged to call for latch assistance as needed.  Father present.   Maternal Data Formula Feeding for Exclusion: No Has patient been taught Hand Expression?: Yes Does the patient have breastfeeding experience prior to this delivery?: No  Feeding Feeding Type: Formula Nipple Type: Other  LATCH Score Latch: Grasps breast easily, tongue down, lips flanged, rhythmical sucking.  Audible Swallowing: A few with stimulation  Type of Nipple: Everted at rest and after stimulation(short shafted)  Comfort (Breast/Nipple): Soft / non-tender  Hold (Positioning): Assistance needed to correctly position infant at breast and maintain latch.  LATCH  Score: 8  Interventions Interventions: Breast feeding basics reviewed;Assisted with latch;Skin to skin;Breast massage;Hand express;Breast compression;Adjust position;DEBP;Position options;Support pillows  Lactation Tools Discussed/Used Breast pump type: Double-Electric Breast Pump WIC Program: Yes Pump Review: Setup, frequency, and cleaning(reviewed)   Consult Status Consult Status: Follow-up Date: 11/19/18 Follow-up type: In-patient    Little Ishikawa 11/18/2018, 5:58 PM

## 2018-11-19 ENCOUNTER — Encounter (HOSPITAL_COMMUNITY): Payer: Self-pay | Admitting: *Deleted

## 2018-11-19 MED ORDER — SENNOSIDES-DOCUSATE SODIUM 8.6-50 MG PO TABS: 2.0000 | ORAL_TABLET | Freq: Every day | ORAL | 1 refills | Status: DC

## 2018-11-19 MED ORDER — IBUPROFEN 800 MG PO TABS: 800.0000 mg | ORAL_TABLET | Freq: Three times a day (TID) | ORAL | 0 refills | Status: AC | PRN

## 2018-11-19 NOTE — Lactation Note (Signed)
This note was copied from a baby's chart. Lactation Consultation Note  Patient Name: Cheyenne Sutton Date: 11/19/2018 Reason for consult: Follow-up assessment;Nipple pain/trauma;Difficult latch Mom called out for feeding assist.  I noted baby has a tight lingual frenulum as baby was crying.  Baby positioned in football hold on left breast.  Colostrum easily hand expressed prior to latch.  Breast is compressible and baby latched easily.  Baby does quite a bit of chewing at breast.  Instructed mom on using good breast massage during feeding.  Baby did sustain latch well and was calm during feeding.  Discussed possible challenges with a restricted frenulum.  Recommended mom wait until milk comes to volume to evaluate.  Stressed importance of obtaining lactation follow up if any difficulty with feeding.  Questions answered.  Maternal Data    Feeding Feeding Type: Breast Fed  LATCH Score Latch: Grasps breast easily, tongue down, lips flanged, rhythmical sucking.  Audible Swallowing: A few with stimulation  Type of Nipple: Everted at rest and after stimulation  Comfort (Breast/Nipple): Filling, red/small blisters or bruises, mild/mod discomfort  Hold (Positioning): Assistance needed to correctly position infant at breast and maintain latch.  LATCH Score: 7  Interventions    Lactation Tools Discussed/Used     Consult Status Consult Status: Complete Date: 11/19/18 Follow-up type: Call as needed    [name] 11/19/2018, 10:42 AM

## 2018-11-19 NOTE — Lactation Note (Signed)
This note was copied from a baby's chart. Lactation Consultation Note  Patient Name: Cheyenne Sutton ACZYS'A Date: 11/19/2018 Reason for consult: Follow-up assessment;Nipple pain/trauma;Difficult latch Baby is 57 hours old/3% weight loss.  Mom is currently eating breakfast and baby is sleeping in crib.  Baby was fed one hour ago.  Discussed milk coming to volume and the prevention and treatment of engorgement.  Mom states she has not been putting baby to breast because nipples are sore.  Nipples intact but slightly red.  Mom states baby becomes frustrated at breast and fussy.  Mom is pumping every 3 hours and recently pumped a few mls of colostrum.  She does have a pump at home.  Discussed transitioning from syringe feeding to a bottle.  Reviewed volume parameters.  Instructed mom to call for LC feeding assist when baby is ready.  Maternal Data    Feeding Feeding Type: Formula  LATCH Score                   Interventions    Lactation Tools Discussed/Used     Consult Status Consult Status: Follow-up Date: 11/19/18 Follow-up type: In-patient    [name] 11/19/2018, 9:26 AM

## 2018-11-19 NOTE — Discharge Instructions (Signed)
Postpartum Care After Cesarean Delivery This sheet gives you information about how to care for yourself from the time you deliver your baby to up to 6-12 weeks after delivery (postpartum period). Your health care provider may also give you more specific instructions. If you have problems or questions, contact your health care provider. Follow these instructions at home: Medicines  Take over-the-counter and prescription medicines only as told by your health care provider.  If you were prescribed an antibiotic medicine, take it as told by your health care provider. Do not stop taking the antibiotic even if you start to feel better.  Ask your health care provider if the medicine prescribed to you: ? Requires you to avoid driving or using heavy machinery. ? Can cause constipation. You may need to take actions to prevent or treat constipation, such as:  Drink enough fluid to keep your urine pale yellow.  Take over-the-counter or prescription medicines.  Eat foods that are high in fiber, such as beans, whole grains, and fresh fruits and vegetables.  Limit foods that are high in fat and processed sugars, such as fried or sweet foods. Activity  Gradually return to your normal activities as told by your health care provider.  Avoid activities that take a lot of effort and energy (are strenuous) until approved by your health care provider. Walking at a slow to moderate pace is usually safe. Ask your health care provider what activities are safe for you. ? Do not lift anything that is heavier than your baby or 10 lb (4.5 kg) as told by your health care provider. ? Do not vacuum, climb stairs, or drive a car for as long as told by your health care provider.  If possible, have someone help you at home until you are able to do your usual activities yourself.  Rest as much as possible. Try to rest or take naps while your baby is sleeping. Vaginal bleeding  It is normal to have vaginal bleeding  (lochia) after delivery. Wear a sanitary pad to absorb vaginal bleeding and discharge. ? During the first week after delivery, the amount and appearance of lochia is often similar to a menstrual period. ? Over the next few weeks, it will gradually decrease to a dry, yellow-brown discharge. ? For most women, lochia stops completely by 4-6 weeks after delivery. Vaginal bleeding can vary from woman to woman.  Change your sanitary pads frequently. Watch for any changes in your flow, such as: ? A sudden increase in volume. ? A change in color. ? Large blood clots.  If you pass a blood clot, save it and call your health care provider to discuss. Do not flush blood clots down the toilet before you get instructions from your health care provider.  Do not use tampons or douches until your health care provider says this is safe.  If you are not breastfeeding, your period should return 6-8 weeks after delivery. If you are breastfeeding, your period may return anytime between 8 weeks after delivery and the time that you stop breastfeeding. Perineal care   If your C-section (Cesarean section) was unplanned, and you were allowed to labor and push before delivery, you may have pain, swelling, and discomfort of the tissue between your vaginal opening and your anus (perineum). You may also have an incision in the tissue (episiotomy) or the tissue may have torn during delivery. Follow these instructions as told by your health care provider: ? Keep your perineum clean and dry as told by   your health care provider. Use medicated pads and pain-relieving sprays and creams as directed. ? If you have an episiotomy or vaginal tear, check the area every day for signs of infection. Check for:  Redness, swelling, or pain.  Fluid or blood.  Warmth.  Pus or a bad smell. ? You may be given a squirt bottle to use instead of wiping to clean the perineum area after you go to the bathroom. As you start healing, you may use  the squirt bottle before wiping yourself. Make sure to wipe gently. ? To relieve pain caused by an episiotomy, vaginal tear, or hemorrhoids, try taking a warm sitz bath 2-3 times a day. A sitz bath is a warm water bath that is taken while you are sitting down. The water should only come up to your hips and should cover your buttocks. Breast care  Within the first few days after delivery, your breasts may feel heavy, full, and uncomfortable (breast engorgement). You may also have milk leaking from your breasts. Your health care provider can suggest ways to help relieve breast discomfort. Breast engorgement should go away within a few days.  If you are breastfeeding: ? Wear a bra that supports your breasts and fits you well. ? Keep your nipples clean and dry. Apply creams and ointments as told by your health care provider. ? You may need to use breast pads to absorb milk leakage. ? You may have uterine contractions every time you breastfeed for several weeks after delivery. Uterine contractions help your uterus return to its normal size. ? If you have any problems with breastfeeding, work with your health care provider or a lactation consultant.  If you are not breastfeeding: ? Avoid touching your breasts as this can make your breasts produce more milk. ? Wear a well-fitting bra and use cold packs to help with swelling. ? Do not squeeze out (express) milk. This causes you to make more milk. Intimacy and sexuality  Ask your health care provider when you can engage in sexual activity. This may depend on your: ? Risk of infection. ? Healing rate. ? Comfort and desire to engage in sexual activity.  You are able to get pregnant after delivery, even if you have not had your period. If desired, talk with your health care provider about methods of family planning or birth control (contraception). Lifestyle  Do not use any products that contain nicotine or tobacco, such as cigarettes, e-cigarettes,  and chewing tobacco. If you need help quitting, ask your health care provider.  Do not drink alcohol, especially if you are breastfeeding. Eating and drinking   Drink enough fluid to keep your urine pale yellow.  Eat high-fiber foods every day. These may help prevent or relieve constipation. High-fiber foods include: ? Whole grain cereals and breads. ? Brown rice. ? Beans. ? Fresh fruits and vegetables.  Take your prenatal vitamins until your postpartum checkup or until your health care provider tells you it is okay to stop. General instructions  Keep all follow-up visits for you and your baby as told by your health care provider. Most women visit their health care provider for a postpartum checkup within the first 3-6 weeks after delivery. Contact a health care provider if you:  Feel unable to cope with the changes that a new baby brings to your life, and these feelings do not go away.  Feel unusually sad or worried.  Have breasts that are painful, hard, or turn red.  Have a fever.    Have trouble holding urine or keeping urine from leaking.  Have little or no interest in activities you used to enjoy.  Have not breastfed at all and you have not had a menstrual period for 12 weeks after delivery.  Have stopped breastfeeding and you have not had a menstrual period for 12 weeks after you stopped breastfeeding.  Have questions about caring for yourself or your baby.  Pass a blood clot from your vagina. Get help right away if you:  Have chest pain.  Have difficulty breathing.  Have sudden, severe leg pain.  Have severe pain or cramping in your abdomen.  Bleed from your vagina so much that you fill more than one sanitary pad in one hour. Bleeding should not be heavier than your heaviest period.  Develop a severe headache.  Faint.  Have blurred vision or spots in your vision.  Have a bad-smelling vaginal discharge.  Have thoughts about hurting yourself or your  baby. If you ever feel like you may hurt yourself or others, or have thoughts about taking your own life, get help right away. You can go to your nearest emergency department or call:  Your local emergency services (911 in the [place]  A suicide crisis helpline, such as the National Suicide Prevention Lifeline at 1-800-273-8255. This is open 24 hours a day. Summary  The period of time from when you deliver your baby to up to 6-12 weeks after delivery is called the postpartum period.  Gradually return to your normal activities as told by your health care provider.  Keep all follow-up visits for you and your baby as told by your health care provider. This information is not intended to replace advice given to you by your health care provider. Make sure you discuss any questions you have with your health care provider. Document Released: 04/15/2000 Document Revised: 12/06/2017 Document Reviewed: 12/06/2017 Elsevier Patient Education  2020 [employer]   Perinatal Depression When a woman feels excessive sadness, anger, or anxiety during pregnancy or during the first 12 months after she gives birth, she has a condition called perinatal depression. Depression can interfere with work, school, relationships, and other everyday activities. If it is not managed properly, it can also cause problems in the mother and her baby. Sometimes, perinatal depression is left untreated because symptoms are thought to be normal mood swings during and right after pregnancy. If you have symptoms of depression, it is important to talk with your health care provider. What are the causes? The exact cause of this condition is not known. Hormonal changes during and after pregnancy may play a role in causing perinatal depression. What increases the risk? You are more likely to develop this condition if:  You have a personal or family history of depression, anxiety, or mood disorders.  You experience a stressful life  event during pregnancy, such as the death of a loved one.  You have a lot of regular life stress.  You do not have support from family members or loved ones, or you are in an abusive relationship. What are the signs or symptoms? Symptoms of this condition include:  Feeling sad or hopeless.  Feelings of guilt.  Feeling irritable or overwhelmed.  Changes in your appetite.  Lack of energy or motivation.  Sleep problems.  Difficulty concentrating or completing tasks.  Loss of interest in hobbies or relationships.  Headaches or stomach problems that do not go away. How is this diagnosed? This condition is diagnosed based on a physical exam   and mental evaluation. In some cases, your health care provider may use a depression screening tool. These tools include a list of questions that can help a health care provider diagnose depression. Your health care provider may refer you to a mental health expert who specializes in depression. How is this treated? This condition may be treated with:  Medicines. Your health care provider will only give you medicines that have been proven safe for pregnancy and breastfeeding.  Talk therapy with a mental health professional to help change your patterns of thinking (cognitive behavioral therapy).  Support groups.  Brain stimulation or light therapies.  Stress reduction therapies, such as mindfulness. Follow these instructions at home: Lifestyle  Do not use any products that contain nicotine or tobacco, such as cigarettes and e-cigarettes. If you need help quitting, ask your health care provider.  Do not use alcohol when you are pregnant. After your baby is born, limit alcohol intake to no more than 1 drink a day. One drink equals 12 oz of beer, 5 oz of wine, or 1 oz of hard liquor.  Consider joining a support group for new mothers. Ask your health care provider for recommendations.  Take good care of yourself. Make sure you: ? Get plenty  of sleep. If you are having trouble sleeping, talk with your health care provider. ? Eat a healthy diet. This includes plenty of fruits and vegetables, whole grains, and lean proteins. ? Exercise regularly, as told by your health care provider. Ask your health care provider what exercises are safe for you. General instructions  Take over-the-counter and prescription medicines only as told by your health care provider.  Talk with your partner or family members about your feelings during pregnancy. Share any concerns or anxieties that you may have.  Ask for help with tasks or chores when you need it. Ask friends and family members to provide meals, watch your children, or help with cleaning.  Keep all follow-up visits as told by your health care provider. This is important. Contact a health care provider if:  You (or people close to you) notice that you have any symptoms of depression.  You have depression and your symptoms get worse.  You experience side effects from medicines, such as nausea or sleep problems. Get help right away if:  You feel like hurting yourself, your baby, or someone else. If you ever feel like you may hurt yourself or others, or have thoughts about taking your own life, get help right away. You can go to your nearest emergency department or call:  Your local emergency services (911 in the [place]  A suicide crisis helpline, such as the National Suicide Prevention Lifeline at 1-800-273-8255. This is open 24 hours a day. Summary  Perinatal depression is when a woman feels excessive sadness, anger, or anxiety during pregnancy or during the first 12 months after she gives birth.  If perinatal depression is not treated, it can lead to health problems for the mother and her baby.  This condition is treated with medicines, talk therapy, stress reduction therapies, or a combination of two or more treatments.  Talk with your partner or family members about your  feelings. Do not be afraid to ask for help. This information is not intended to replace advice given to you by your health care provider. Make sure you discuss any questions you have with your health care provider. Document Released: 06/15/2016 Document Revised: 04/21/2017 Document Reviewed: 06/15/2016 Elsevier Patient Education  2020 Elsevier Inc.  

## 2019-12-04 ENCOUNTER — Other Ambulatory Visit: Payer: Self-pay | Admitting: Family Medicine

## 2019-12-04 ENCOUNTER — Other Ambulatory Visit: Payer: Self-pay

## 2019-12-04 ENCOUNTER — Ambulatory Visit
Admission: RE | Admit: 2019-12-04 | Discharge: 2019-12-04 | Disposition: A | Discharge status: Home / Self Care | Payer: Medicaid Other | Source: Ambulatory Visit | Attending: Family Medicine | Admitting: Family Medicine

## 2019-12-04 DIAGNOSIS — M5432 Sciatica, left side: Secondary | ICD-10-CM

## 2019-12-17 ENCOUNTER — Encounter: Payer: Self-pay | Admitting: Physical Therapy

## 2019-12-17 ENCOUNTER — Other Ambulatory Visit: Payer: Self-pay

## 2019-12-17 ENCOUNTER — Ambulatory Visit: Payer: Medicaid Other | Attending: Family Medicine | Admitting: Physical Therapy

## 2019-12-17 DIAGNOSIS — M6281 Muscle weakness (generalized): Secondary | ICD-10-CM

## 2019-12-17 DIAGNOSIS — R252 Cramp and spasm: Secondary | ICD-10-CM | POA: Insufficient documentation

## 2019-12-17 NOTE — Therapy (Signed)
[facility] [address] [place], [place], [address] Phone: [phone]   Fax:  [phone]  Physical Therapy Evaluation  Patient Details  Name: Cheyenne Sutton MRN: [id number] Date of Birth: 1990/05/19 Referring Provider (PT): Shirlean Mylar, MD   Encounter Date: 12/17/2019   PT End of Session - 12/17/19 1737    Visit Number 1    Date for PT Re-Evaluation 02/11/20    PT Start Time 1450    PT Stop Time 1523    PT Time Calculation (min) 33 min    Activity Tolerance Patient tolerated treatment well    Behavior During Therapy [facility] for tasks assessed/performed           Past Medical History:  Diagnosis Date  . Anxiety   . Asthma   . Depression   . Folliculitis   . Hernia   . Hydradenitis   . IBS (irritable bowel syndrome)     Past Surgical History:  Procedure Laterality Date  . CESAREAN SECTION N/A 11/16/2018   Procedure: CESAREAN SECTION;  Surgeon: Conan Bowens, MD;  Location: [facility] LD ORS;  Service: Obstetrics;  Laterality: N/A;  . HERNIA REPAIR      There were no vitals filed for this visit.    Subjective Assessment - 12/17/19 1450    Subjective Pt states during the pregnancy she had sciatic pain bad but she had had it for years prior off and on.    How long can you walk comfortably? hurts when walking    Patient Stated Goals get rid of the pain    Currently in Pain? Yes    Pain Score 6    10/10 worst   Pain Location Back    Pain Orientation Left    Pain Descriptors / Indicators Aching;Sharp    Pain Type Chronic pain    Pain Radiating Towards Lt hip down the back of the leg to the ankle; knee feels like it will give out    Pain Frequency Intermittent    Aggravating Factors  sleeping the wrong way    Pain Relieving Factors sitting, stretching, walk it off              [facility] PT Assessment - 12/17/19 0001      Assessment   Medical Diagnosis M54.32 (ICD-10-CM) - Sciatica, left side    Referring  Provider (PT) Shirlean Mylar, MD    Onset Date/Surgical Date --   1 year   Prior Therapy Yes in high school      Precautions   Precautions None      Balance Screen   Has the patient fallen in the past 6 months No      Home Environment   Living Environment Private residence      Prior Function   Level of Independence Independent    Vocation Full time employment    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      Cognition   Overall Cognitive Status Within Functional Limits for tasks assessed      Observation/Other Assessments   Observations diastasis rectus abdominals 2 fingers one inch above and below umbilicus      Posture/Postural Control   Posture/Postural Control Postural limitations    Postural Limitations Rounded Shoulders;Increased thoracic kyphosis;Increased lumbar lordosis;Anterior pelvic tilt;Weight shift right      ROM / Strength   AROM / PROM / Strength AROM;PROM;Strength      PROM   Overall PROM Comments FABER and FADIR Lt  is painful; ER Lt LE painful      Strength   Overall Strength Comments hip abduction Lt 4/5       Flexibility   Soft Tissue Assessment /Muscle Length yes    Hamstrings 80%      Palpation   Palpation comment lumbar, gluteals, hamstrings tight, Lt piriformis TTP      Special Tests   Other special tests ASLR improved with compression       Ambulation/Gait   Gait Pattern Decreased stride length                      Objective measurements completed on examination: See above findings.     Pelvic Floor Special Questions - 12/17/19 0001    Prior Pelvic/Prostate Exam Yes    Are you Pregnant or attempting pregnancy? No    Prior Pregnancies Yes    Number of C-Sections 1    Currently Sexually Active Yes    Is this Painful No    Urinary Leakage No    Urinary urgency No    Urinary frequency normal    Fecal incontinence --   IBS and sometimes straining   Fluid intake 20-60oz depending    Falling out feeling (prolapse) No      Exam Type Deferred   no issues in hisotry to indicate this is needed                        PT Long Term Goals - 12/17/19 1717      PT LONG TERM GOAL #1   Title Pt will report at least 60% less pain    Baseline up to 10/10 and aches all the time    Time 8    Period Weeks    Status New    Target Date 02/11/20      PT LONG TERM GOAL #2   Title Pt will demonstrate diastasis of rectus abdominus reduced to <1 finger width to demonstrate improved core stability    Baseline 2 fingers at umbilicus and one inch above and below    Time 8    Period Weeks    Status New    Target Date 02/11/20      PT LONG TERM GOAL #3   Title Pt will demonstrate bilateral hip abduction of 5/5 for improved functional lifting and walking with greater stability    Baseline 4/5 Lt LE; 5/5 Rt LE    Time 8    Period Weeks    Status New    Target Date 02/11/20      PT LONG TERM GOAL #4   Title Pt will be ind with advanced HEP    Baseline does not know yet    Time 8    Period Weeks    Status New    Target Date 02/11/20                  Plan - 12/17/19 1722    Clinical Impression Statement Pt presents to clinic due to pain down the Lt LE that has been worse ever since her pregnancy one year ago.  Pt demonstrates posture abnormalities as mentioned above.  She is tight throughout lubmar, gluteals, and hamstrings bilaterally.  Pt has diastasis of rectus abdominus and difficutly activating her core without holding her breath.  Pt has weakness as mentioned above and alters her weight in standing towards the Rt side.  Pt has positive ASLR test  also confirming weakness of the core. Pt will benefit from skilled PT to address impairments and return to maximum function.    Personal Factors and Comorbidities Time since onset of injury/illness/exacerbation    Examination-Activity Limitations Caring for Others;Carry;Locomotion Level;Sleep    Examination-Participation Restrictions Community  Activity    Stability/Clinical Decision Making Evolving/Moderate complexity    Clinical Decision Making Moderate    Rehab Potential Excellent    PT Frequency 1x / week    PT Duration 8 weeks    PT Treatment/Interventions ADLs/Self Care Home Management;Biofeedback;Cryotherapy;Electrical Stimulation;Moist Heat;Therapeutic activities;Neuromuscular re-education;Patient/family education;Therapeutic exercise;Manual techniques;Taping;Passive range of motion;Dry needling    PT Next Visit Plan lumbar and h/s stretches; core strengthening program issued    PT Home Exercise Plan issue next - reviewed h/s and hip flexor stretch    Consulted and Agree with Plan of Care Patient           Patient will benefit from skilled therapeutic intervention in order to improve the following deficits and impairments:  Pain, Postural dysfunction, Impaired flexibility, Increased fascial restricitons, Decreased strength, Decreased coordination, Increased muscle spasms, Obesity  Visit Diagnosis: Muscle weakness (generalized)  Cramp and spasm     Problem List Patient Active Problem List   Diagnosis Date Noted  . Status post primary low transverse cesarean section 11/17/2018  . Abnormal fetal ultrasound 11/16/2018  . Oligohydramnios 11/16/2018    Junious Silk, PT 12/17/2019, 5:52 PM  [facility] [address], [address] [place], [place], [address] Phone: [phone]   Fax:  [phone]  Name: Cheyenne Sutton MRN: [id number] Date of Birth: 1991/01/27

## 2020-01-02 ENCOUNTER — Other Ambulatory Visit: Payer: Self-pay

## 2020-01-02 ENCOUNTER — Ambulatory Visit: Payer: Medicaid Other | Attending: Family Medicine | Admitting: Physical Therapy

## 2020-01-02 ENCOUNTER — Encounter: Payer: Self-pay | Admitting: Physical Therapy

## 2020-01-02 DIAGNOSIS — R252 Cramp and spasm: Secondary | ICD-10-CM | POA: Insufficient documentation

## 2020-01-02 DIAGNOSIS — M6281 Muscle weakness (generalized): Secondary | ICD-10-CM

## 2020-01-02 NOTE — Therapy (Addendum)
[facility] [address], [place], [place], [address] Phone: [phone]   Fax:  [phone]  Physical Therapy Treatment  Patient Details  Name: Cheyenne Sutton MRN: [id number] Date of Birth: 12/11/1990 Referring Provider (PT): Maurice Small, MD   Encounter Date: 01/02/2020   PT End of Session - 01/02/20 1344    Visit Number 2    Date for PT Re-Evaluation 02/11/20    PT Start Time 1696    PT Stop Time 7893    PT Time Calculation (min) 39 min    Activity Tolerance Patient tolerated treatment well;No increased pain    Behavior During Therapy WFL for tasks assessed/performed           Past Medical History:  Diagnosis Date  . Anxiety   . Asthma   . Depression   . Folliculitis   . Hernia   . Hydradenitis   . IBS (irritable bowel syndrome)     Past Surgical History:  Procedure Laterality Date  . CESAREAN SECTION N/A 11/16/2018   Procedure: CESAREAN SECTION;  Surgeon: Sloan Leiter, MD;  Location: [facility] LD ORS;  Service: Obstetrics;  Laterality: N/A;  . HERNIA REPAIR      There were no vitals filed for this visit.   Subjective Assessment - 01/02/20 1237    Subjective Pt states that things are going ok. She has been working on some stretches but she completely forgot about the ones given at her evaluation. She had some pain yesterday after walking for a while. She has no pain currently.    How long can you walk comfortably? hurts when walking    Patient Stated Goals get rid of the pain    Currently in Pain? No/denies                             [facility] Adult PT Treatment/Exercise - 01/02/20 0001      Exercises   Exercises Lumbar      Lumbar Exercises: Stretches   Double Knee to Chest Stretch Limitations LE on red physioball x10 reps     Hip Flexor Stretch 3 reps;20 seconds    Hip Flexor Stretch Limitations standing on step      Lumbar Exercises: Supine   Ab Set 10 reps;3 seconds     Bridge 5 reps    Bridge Limitations x2 sets; holding blue band across pelvis for additional glute activation    Isometric Hip Flexion 10 reps;3 seconds    Other Supine Lumbar Exercises low trunk rotation x10 reps                   PT Education - 01/02/20 1344    Education Details updated HEP    Person(s) Educated Patient    Methods Explanation;Handout    Comprehension Verbalized understanding               PT Long Term Goals - 12/17/19 1717      PT LONG TERM GOAL #1   Title Pt will report at least 60% less pain    Baseline up to 10/10 and aches all the time    Time 8    Period Weeks    Status New    Target Date 02/11/20      PT LONG TERM GOAL #2   Title Pt will demonstrate diastasis of rectus abdominus reduced to <1 finger width to demonstrate improved core stability  Baseline 2 fingers at umbilicus and one inch above and below    Time 8    Period Weeks    Status New    Target Date 02/11/20      PT LONG TERM GOAL #3   Title Pt will demonstrate bilateral hip abduction of 5/5 for improved functional lifting and walking with greater stability    Baseline 4/5 Lt LE; 5/5 Rt LE    Time 8    Period Weeks    Status New    Target Date 02/11/20      PT LONG TERM GOAL #4   Title Pt will be ind with advanced HEP    Baseline does not know yet    Time 8    Period Weeks    Status New    Target Date 02/11/20                 Plan - 01/02/20 1344    Clinical Impression Statement Pt arrived without low back or Lt LE pain, but she had exacerbation of her pain after long bouts of walking yesterday. Today's session focused on educating pt on proper deep abdominal activation to avoid doming with activity. She required intermittent verbal cuing with supine exercises to improve this. Pt had increase in Lt buttock pain initially with bridges, but this was resolved with the addition of theraband resistance to encourage gluteal activation. Pt was provided a copy of her  HEP and she demonstrated understanding of this.    Personal Factors and Comorbidities Time since onset of injury/illness/exacerbation    Examination-Activity Limitations Caring for Others;Carry;Locomotion Level;Sleep    Examination-Participation Restrictions Community Activity    Stability/Clinical Decision Making Evolving/Moderate complexity    Rehab Potential Excellent    PT Frequency 1x / week    PT Duration 8 weeks    PT Treatment/Interventions ADLs/Self Care Home Management;Biofeedback;Cryotherapy;Electrical Stimulation;Moist Heat;Therapeutic activities;Neuromuscular re-education;Patient/family education;Therapeutic exercise;Manual techniques;Taping;Passive range of motion;Dry needling    PT Next Visit Plan core strengthening progression; glute strengthening    PT Home Exercise Plan [id number]    Consulted and Agree with Plan of Care Patient           Patient will benefit from skilled therapeutic intervention in order to improve the following deficits and impairments:  Pain, Postural dysfunction, Impaired flexibility, Increased fascial restricitons, Decreased strength, Decreased coordination, Increased muscle spasms, Obesity  Visit Diagnosis: Muscle weakness (generalized)  Cramp and spasm     Problem List Patient Active Problem List   Diagnosis Date Noted  . Status post primary low transverse cesarean section 11/17/2018  . Abnormal fetal ultrasound 11/16/2018  . Oligohydramnios 11/16/2018    1:51 PM,01/02/20 Sherol Dade PT, DPT [facility]   PHYSICAL THERAPY DISCHARGE SUMMARY  Visits from Start of Care: 2  Current functional level related to goals / functional outcomes: Pt did not return, having two consecutive no shows.  D/C'd per attendance policy.   Remaining deficits: See above   Education / Equipment: HEP Plan: Patient agrees to discharge.  Patient goals were not met. Patient is being discharged due to  not returning since the last visit.  ?????         Baruch Merl, PT 01/28/20 3:30 PM   [facility] [address] [place], [place], [address] Phone: [phone]   Fax:  [phone]  Name: Cheyenne Sutton MRN: [id number] Date of Birth: 09/11/1990

## 2020-01-14 ENCOUNTER — Encounter: Payer: Medicaid Other | Admitting: Physical Therapy

## 2020-01-24 ENCOUNTER — Telehealth: Payer: Self-pay | Admitting: Physical Therapy

## 2020-01-24 ENCOUNTER — Ambulatory Visit: Payer: Medicaid Other | Admitting: Physical Therapy

## 2020-01-24 NOTE — Telephone Encounter (Signed)
Pt missed 2nd consecutive PT appointment today 01/24/20 at 8:45am.  PT called and left VM asking Pt to call back before next scheduled appointment to discuss her schedule.  Leaira Fullam, PT 01/24/20 9:13 AM

## 2020-01-28 ENCOUNTER — Ambulatory Visit: Payer: Medicaid Other | Admitting: Physical Therapy

## 2020-10-01 ENCOUNTER — Other Ambulatory Visit: Payer: Self-pay

## 2020-10-01 ENCOUNTER — Ambulatory Visit (HOSPITAL_COMMUNITY)
Admission: RE | Admit: 2020-10-01 | Discharge: 2020-10-01 | Disposition: A | Discharge status: Home / Self Care | Payer: Medicaid Other | Source: Ambulatory Visit | Attending: Family Medicine | Admitting: Family Medicine

## 2020-10-01 ENCOUNTER — Other Ambulatory Visit (HOSPITAL_COMMUNITY): Payer: Self-pay | Admitting: Family Medicine

## 2020-10-01 DIAGNOSIS — R600 Localized edema: Secondary | ICD-10-CM | POA: Diagnosis not present

## 2021-03-24 ENCOUNTER — Other Ambulatory Visit: Payer: Self-pay | Admitting: Sports Medicine

## 2021-03-24 DIAGNOSIS — M5442 Lumbago with sciatica, left side: Secondary | ICD-10-CM

## 2021-04-29 ENCOUNTER — Ambulatory Visit
Admission: RE | Admit: 2021-04-29 | Discharge: 2021-04-29 | Disposition: A | Discharge status: Home / Self Care | Payer: Medicaid Other | Source: Ambulatory Visit | Attending: Sports Medicine | Admitting: Sports Medicine

## 2021-04-29 ENCOUNTER — Other Ambulatory Visit: Payer: Self-pay

## 2021-04-29 DIAGNOSIS — M5442 Lumbago with sciatica, left side: Secondary | ICD-10-CM

## 2021-05-28 LAB — OB RESULTS CONSOLE GC/CHLAMYDIA
Chlamydia: NEGATIVE
Neisseria Gonorrhea: NEGATIVE

## 2021-05-28 LAB — OB RESULTS CONSOLE RPR: RPR: NONREACTIVE

## 2021-05-28 LAB — OB RESULTS CONSOLE RUBELLA ANTIBODY, IGM: Rubella: IMMUNE

## 2021-05-28 LAB — OB RESULTS CONSOLE HIV ANTIBODY (ROUTINE TESTING): HIV: NONREACTIVE

## 2021-05-28 LAB — OB RESULTS CONSOLE HEPATITIS B SURFACE ANTIGEN: Hepatitis B Surface Ag: NEGATIVE

## 2021-11-30 LAB — OB RESULTS CONSOLE GBS: GBS: NEGATIVE

## 2021-12-21 ENCOUNTER — Inpatient Hospital Stay (HOSPITAL_COMMUNITY)
Admission: AD | Admit: 2021-12-21 | Discharge: 2021-12-24 | DRG: 787 | Disposition: A | Discharge status: Home / Self Care | Payer: Managed Care, Other (non HMO) | Attending: Obstetrics and Gynecology | Admitting: Obstetrics and Gynecology

## 2021-12-21 ENCOUNTER — Inpatient Hospital Stay (HOSPITAL_COMMUNITY): Payer: Managed Care, Other (non HMO) | Admitting: Anesthesiology

## 2021-12-21 ENCOUNTER — Other Ambulatory Visit: Payer: Self-pay

## 2021-12-21 ENCOUNTER — Inpatient Hospital Stay (HOSPITAL_COMMUNITY): Payer: Managed Care, Other (non HMO) | Attending: Obstetrics and Gynecology

## 2021-12-21 ENCOUNTER — Encounter (HOSPITAL_COMMUNITY): Payer: Self-pay | Admitting: Obstetrics and Gynecology

## 2021-12-21 ENCOUNTER — Inpatient Hospital Stay (HOSPITAL_COMMUNITY)
Admission: AD | Admit: 2021-12-21 | Payer: Managed Care, Other (non HMO) | Source: Home / Self Care | Admitting: Obstetrics and Gynecology

## 2021-12-21 DIAGNOSIS — Z3A39 39 weeks gestation of pregnancy: Secondary | ICD-10-CM | POA: Diagnosis not present

## 2021-12-21 DIAGNOSIS — L732 Hidradenitis suppurativa: Secondary | ICD-10-CM | POA: Diagnosis present

## 2021-12-21 DIAGNOSIS — O26893 Other specified pregnancy related conditions, third trimester: Secondary | ICD-10-CM | POA: Diagnosis present

## 2021-12-21 DIAGNOSIS — O34211 Maternal care for low transverse scar from previous cesarean delivery: Principal | ICD-10-CM | POA: Diagnosis present

## 2021-12-21 DIAGNOSIS — O9952 Diseases of the respiratory system complicating childbirth: Secondary | ICD-10-CM | POA: Diagnosis not present

## 2021-12-21 DIAGNOSIS — Z3490 Encounter for supervision of normal pregnancy, unspecified, unspecified trimester: Principal | ICD-10-CM | POA: Diagnosis present

## 2021-12-21 DIAGNOSIS — L02411 Cutaneous abscess of right axilla: Secondary | ICD-10-CM | POA: Diagnosis present

## 2021-12-21 DIAGNOSIS — O9972 Diseases of the skin and subcutaneous tissue complicating childbirth: Secondary | ICD-10-CM | POA: Diagnosis present

## 2021-12-21 DIAGNOSIS — Z98891 History of uterine scar from previous surgery: Secondary | ICD-10-CM

## 2021-12-21 DIAGNOSIS — J45909 Unspecified asthma, uncomplicated: Secondary | ICD-10-CM | POA: Diagnosis not present

## 2021-12-21 HISTORY — DX: Panic disorder (episodic paroxysmal anxiety): F41.0

## 2021-12-21 LAB — TYPE AND SCREEN
ABO/RH(D): O POS
Antibody Screen: NEGATIVE

## 2021-12-21 LAB — CBC
HCT: 37.3 % (ref 36.0–46.0)
Hemoglobin: 12.7 g/dL (ref 12.0–15.0)
MCH: 29.7 pg (ref 26.0–34.0)
MCHC: 34 g/dL (ref 30.0–36.0)
MCV: 87.4 fL (ref 80.0–100.0)
Platelets: 165 10*3/uL (ref 150–400)
RBC: 4.27 MIL/uL (ref 3.87–5.11)
RDW: 14 % (ref 11.5–15.5)
WBC: 7.1 10*3/uL (ref 4.0–10.5)
nRBC: 0 % (ref 0.0–0.2)

## 2021-12-21 MED ORDER — ONDANSETRON HCL 4 MG/2ML IJ SOLN
4.0000 mg | Freq: Four times a day (QID) | INTRAMUSCULAR | Status: DC | PRN
  Administered 2021-12-22: 4 mg via INTRAVENOUS

## 2021-12-21 MED ORDER — LACTATED RINGERS IV SOLN
500.0000 mL | INTRAVENOUS | Status: DC | PRN
  Administered 2021-12-21: 1000 mL via INTRAVENOUS

## 2021-12-21 MED ORDER — PHENYLEPHRINE 80 MCG/ML (10ML) SYRINGE FOR IV PUSH (FOR BLOOD PRESSURE SUPPORT): 80.0000 ug | PREFILLED_SYRINGE | INTRAVENOUS | Status: DC | PRN

## 2021-12-21 MED ORDER — OXYCODONE-ACETAMINOPHEN 5-325 MG PO TABS: 2.0000 | ORAL_TABLET | ORAL | Status: DC | PRN

## 2021-12-21 MED ORDER — OXYTOCIN-SODIUM CHLORIDE 30-0.9 UT/500ML-% IV SOLN
1.0000 m[IU]/min | INTRAVENOUS | Status: DC
  Administered 2021-12-21 – 2021-12-22 (×2): 2 m[IU]/min via INTRAVENOUS
  Filled 2021-12-21: qty 500

## 2021-12-21 MED ORDER — TERBUTALINE SULFATE 1 MG/ML IJ SOLN
0.2500 mg | Freq: Once | INTRAMUSCULAR | Status: DC | PRN
Start: 2021-12-21 — End: 2021-12-22

## 2021-12-21 MED ORDER — SOD CITRATE-CITRIC ACID 500-334 MG/5ML PO SOLN
30.0000 mL | ORAL | Status: DC | PRN
  Administered 2021-12-22: 30 mL via ORAL
  Filled 2021-12-21: qty 30

## 2021-12-21 MED ORDER — ACETAMINOPHEN 325 MG PO TABS: 650.0000 mg | ORAL_TABLET | ORAL | Status: DC | PRN

## 2021-12-21 MED ORDER — OXYTOCIN-SODIUM CHLORIDE 30-0.9 UT/500ML-% IV SOLN
2.5000 [IU]/h | INTRAVENOUS | Status: DC
  Administered 2021-12-22: 18 [IU] via INTRAVENOUS

## 2021-12-21 MED ORDER — OXYCODONE-ACETAMINOPHEN 5-325 MG PO TABS: 1.0000 | ORAL_TABLET | ORAL | Status: DC | PRN

## 2021-12-21 MED ORDER — OXYTOCIN BOLUS FROM INFUSION: 333.0000 mL | Freq: Once | INTRAVENOUS | Status: DC

## 2021-12-21 MED ORDER — ZOLPIDEM TARTRATE 5 MG PO TABS: 5.0000 mg | ORAL_TABLET | Freq: Every evening | ORAL | Status: DC | PRN

## 2021-12-21 MED ORDER — LACTATED RINGERS IV SOLN
500.0000 mL | Freq: Once | INTRAVENOUS | Status: AC
  Administered 2021-12-21: 500 mL via INTRAVENOUS

## 2021-12-21 MED ORDER — EPHEDRINE 5 MG/ML INJ: 10.0000 mg | INTRAVENOUS | Status: DC | PRN

## 2021-12-21 MED ORDER — LIDOCAINE-EPINEPHRINE (PF) 1.5 %-1:200000 IJ SOLN
INTRAMUSCULAR | Status: DC | PRN
  Administered 2021-12-21: 5 mL via EPIDURAL

## 2021-12-21 MED ORDER — DIPHENHYDRAMINE HCL 50 MG/ML IJ SOLN: 12.5000 mg | INTRAMUSCULAR | Status: DC | PRN

## 2021-12-21 MED ORDER — LACTATED RINGERS IV SOLN
INTRAVENOUS | Status: DC
  Administered 2021-12-22: 125 mL/h via INTRAVENOUS

## 2021-12-21 MED ORDER — FENTANYL-BUPIVACAINE-NACL 0.5-0.125-0.9 MG/250ML-% EP SOLN
12.0000 mL/h | EPIDURAL | Status: DC | PRN
  Administered 2021-12-21: 12 mL/h via EPIDURAL
  Filled 2021-12-21: qty 250

## 2021-12-21 MED ORDER — LIDOCAINE HCL (PF) 1 % IJ SOLN
30.0000 mL | INTRAMUSCULAR | Status: DC | PRN
Start: 2021-12-21 — End: 2021-12-22

## 2021-12-21 NOTE — Anesthesia Procedure Notes (Signed)
Epidural Patient location during procedure: OB Start time: 12/21/2021 8:57 PM End time: 12/21/2021 9:04 PM  Staffing Anesthesiologist: Atilano Median, DO Performed: anesthesiologist   Preanesthetic Checklist Completed: patient identified, IV checked, site marked, risks and benefits discussed, surgical consent, monitors and equipment checked, pre-op evaluation and timeout performed  Epidural Patient position: sitting Prep: ChloraPrep Patient monitoring: heart rate, continuous pulse ox and blood pressure Approach: midline Location: L3-L4 Injection technique: LOR saline  Needle:  Needle type: Tuohy  Needle gauge: 17 G Needle length: 9 cm Needle insertion depth: 7 cm Catheter type: closed end flexible Catheter size: 20 Guage Catheter at skin depth: 12 cm Test dose: negative and 1.5% lidocaine with Epi 1:200 K  Assessment Events: blood not aspirated, injection not painful, no injection resistance and no paresthesia  Additional Notes   Patient identified. Risks/Benefits/Options discussed with patient including but not limited to bleeding, infection, nerve damage, paralysis, failed block, incomplete pain control, headache, blood pressure changes, nausea, vomiting, reactions to medications, itching and postpartum back pain. Confirmed with bedside nurse the patient's most recent platelet count. Confirmed with patient that they are not currently taking any anticoagulation, have any bleeding history or any family history of bleeding disorders. Patient expressed understanding and wished to proceed. All questions were answered. Sterile technique was used throughout the entire procedure. Please see nursing notes for vital signs. Test dose was given through epidural catheter and negative prior to continuing to dose epidural or start infusion. Warning signs of high block given to the patient including shortness of breath, tingling/numbness in hands, complete motor block, or any concerning  symptoms with instructions to call for help. Patient was given instructions on fall risk and not to get out of bed. All questions and concerns addressed with instructions to call with any issues or inadequate analgesia.    Reason for block:procedure for pain

## 2021-12-21 NOTE — Progress Notes (Signed)
MD consented pt with risks and benefits of VBAC.  Pt states understanding. Pt, MD, RN signed VBAC consent.

## 2021-12-21 NOTE — Anesthesia Preprocedure Evaluation (Addendum)
Anesthesia Evaluation  Patient identified by MRN, date of birth, ID band Patient awake    Reviewed: Allergy & Precautions, NPO status , Patient's Chart, lab work & pertinent test results  Airway Mallampati: II  TM Distance: >3 FB Neck ROM: Full    Dental no notable dental hx.    Pulmonary asthma ,    Pulmonary exam normal        Cardiovascular negative cardio ROS   Rhythm:Regular Rate:Normal     Neuro/Psych Anxiety Depression negative neurological ROS     GI/Hepatic negative GI ROS, Neg liver ROS,   Endo/Other  negative endocrine ROS  Renal/GU negative Renal ROS  negative genitourinary   Musculoskeletal negative musculoskeletal ROS (+)   Abdominal Normal abdominal exam  (+)   Peds  Hematology negative hematology ROS (+)   Anesthesia Other Findings   Reproductive/Obstetrics (+) Pregnancy                             Anesthesia Physical Anesthesia Plan  ASA: 2  Anesthesia Plan: Epidural   Post-op Pain Management:    Induction:   PONV Risk Score and Plan: Treatment may vary due to age or medical condition  Airway Management Planned: Natural Airway  Additional Equipment: None  Intra-op Plan:   Post-operative Plan:   Informed Consent: I have reviewed the patients History and Physical, chart, labs and discussed the procedure including the risks, benefits and alternatives for the proposed anesthesia with the patient or authorized representative who has indicated his/her understanding and acceptance.     Dental advisory given  Plan Discussed with:   Anesthesia Plan Comments: (Lab Results      Component                Value               Date                      WBC                      7.1                 12/21/2021                HGB                      12.7                12/21/2021                HCT                      37.3                12/21/2021                 MCV                      87.4                12/21/2021                PLT                      165  12/21/2021          )        Anesthesia Quick Evaluation

## 2021-12-21 NOTE — H&P (Signed)
Cheyenne Sutton is a 31 y.o. female presenting for painful ctxs that started at 0200.  Was scheduled for elective IOL at 39 weeks but was on hold.  She is a previous c/s for NR FHR and wants TOLAC.  In MAU had a 2 minute prolonged decel with spontaneous recovery.  Wants to continue with IOL/Augmentation at this time and is wanting an epidural.  Pregnancy otherwise uncomplicated.  GBS negative. OB History     Gravida  2   Para  1   Term  1   Preterm      AB      Living  1      SAB      IAB      Ectopic      Multiple      Live Births  1          Past Medical History:  Diagnosis Date   Anxiety    Asthma    Depression    Folliculitis    Hernia    Hydradenitis    IBS (irritable bowel syndrome)    Past Surgical History:  Procedure Laterality Date   CESAREAN SECTION N/A 11/16/2018   Procedure: CESAREAN SECTION;  Surgeon: Conan Bowens, MD;  Location: [facility] LD ORS;  Service: Obstetrics;  Laterality: N/A;   HERNIA REPAIR     Family History: family history is not on file. Social History:  reports that she has never smoked. She has never used smokeless tobacco. She reports that she does not currently use alcohol. She reports that she does not currently use drugs after having used the following drugs: Marijuana.     Maternal Diabetes: No Genetic Screening: Normal Maternal Ultrasounds/Referrals: Normal Fetal Ultrasounds or other Referrals:  None Maternal Substance Abuse:  No Significant Maternal Medications:  None Significant Maternal Lab Results:  Group B Strep negative Number of Prenatal Visits:greater than 3 verified prenatal visits Other Comments:  None  Review of Systems History Dilation: Closed Effacement (%): Thick Station: -3 Exam by:: Dr. Rana Snare Blood pressure 121/78, pulse 92, temperature 99.1 F (37.3 C), temperature source Oral, resp. rate 18, height 5\' 4"  (1.626 m), weight 93.4 kg, SpO2 99 %, unknown if currently breastfeeding. Exam Physical Exam   Vitals and nursing note reviewed. Exam conducted with a chaperone present.  Constitutional:      Appearance: Normal appearance.  HENT:     Head: Normocephalic.  Eyes:     Pupils: Pupils are equal, round, and reactive to light.  Cardiovascular:     Rate and Rhythm: Normal rate and regular rhythm.     Pulses: Normal pulses.  Abdominal:     General: Abdomen is Gravid, nontender Neurological:     Mental Status: She is alert.  Prenatal labs: ABO, Rh: --/--/O POS (08/22 1535) Antibody: NEG (08/22 1535) Rubella:   RPR: Nonreactive (01/27 0000)  HBsAg: Negative (01/27 0000)  HIV: Non-reactive (01/27 0000)  GBS: Negative/-- (08/01 0000)   Assessment/Plan: IUP at 39 weeks Presented in latent labor but to be admitted for elective IOL with TOLAC We discussed the risks of TOLAC, low threshold for repeat C/S, and limitations of medicines used for IOL due to prev c/s Her questions were answered and she has given informed consent We discussed the presenting bradycardia episode and discussed if this recurs I would rec a repeat c/s. Will augment labor with pitocin.  [name] is interested in epidural   [name] 12/21/2021, 8:52 PM

## 2021-12-21 NOTE — MAU Note (Signed)
Cheyenne Sutton is a 31 y.o. at [redacted]w[redacted]d here in MAU reporting: contractions started at 2 this morning and have gotten closer and stronger. They are about every 3-6 min. Denies bleeding or LOF. +FM  Onset of complaint: today  Pain score: 8/10  Vitals:   12/21/21 1432  BP: 109/71  Pulse: 95  Resp: 16  Temp: 98.1 F (36.7 C)  SpO2: 100%     FHT:+FM, EFM applied in room  Lab orders placed from triage: none

## 2021-12-22 ENCOUNTER — Encounter (HOSPITAL_COMMUNITY)
Admission: AD | Disposition: A | Discharge status: Home / Self Care | Payer: Self-pay | Source: Home / Self Care | Attending: Obstetrics and Gynecology

## 2021-12-22 ENCOUNTER — Encounter (HOSPITAL_COMMUNITY): Payer: Self-pay | Admitting: Obstetrics and Gynecology

## 2021-12-22 ENCOUNTER — Other Ambulatory Visit: Payer: Self-pay

## 2021-12-22 DIAGNOSIS — Z3A39 39 weeks gestation of pregnancy: Secondary | ICD-10-CM

## 2021-12-22 DIAGNOSIS — O9952 Diseases of the respiratory system complicating childbirth: Secondary | ICD-10-CM

## 2021-12-22 DIAGNOSIS — J45909 Unspecified asthma, uncomplicated: Secondary | ICD-10-CM

## 2021-12-22 DIAGNOSIS — Z98891 History of uterine scar from previous surgery: Secondary | ICD-10-CM

## 2021-12-22 LAB — RPR: RPR Ser Ql: NONREACTIVE

## 2021-12-22 SURGERY — Surgical Case
Anesthesia: Epidural

## 2021-12-22 MED ORDER — PRENATAL MULTIVITAMIN CH
1.0000 | ORAL_TABLET | Freq: Every day | ORAL | Status: DC
  Administered 2021-12-23: 1 via ORAL
  Filled 2021-12-22: qty 1

## 2021-12-22 MED ORDER — TETANUS-DIPHTH-ACELL PERTUSSIS 5-2.5-18.5 LF-MCG/0.5 IM SUSY: 0.5000 mL | PREFILLED_SYRINGE | Freq: Once | INTRAMUSCULAR | Status: DC

## 2021-12-22 MED ORDER — PHENYLEPHRINE 80 MCG/ML (10ML) SYRINGE FOR IV PUSH (FOR BLOOD PRESSURE SUPPORT)
PREFILLED_SYRINGE | INTRAVENOUS | Status: AC
  Filled 2021-12-22: qty 20

## 2021-12-22 MED ORDER — ONDANSETRON HCL 4 MG/2ML IJ SOLN
INTRAMUSCULAR | Status: AC
  Filled 2021-12-22: qty 2

## 2021-12-22 MED ORDER — KETOROLAC TROMETHAMINE 30 MG/ML IJ SOLN: 30.0000 mg | Freq: Four times a day (QID) | INTRAMUSCULAR | Status: AC | PRN

## 2021-12-22 MED ORDER — ALBUTEROL SULFATE (2.5 MG/3ML) 0.083% IN NEBU: 2.5000 mg | INHALATION_SOLUTION | Freq: Four times a day (QID) | RESPIRATORY_TRACT | Status: DC | PRN

## 2021-12-22 MED ORDER — ACETAMINOPHEN 10 MG/ML IV SOLN
INTRAVENOUS | Status: DC | PRN
  Administered 2021-12-22: 1000 mg via INTRAVENOUS

## 2021-12-22 MED ORDER — DIPHENHYDRAMINE HCL 25 MG PO CAPS: 25.0000 mg | ORAL_CAPSULE | ORAL | Status: DC | PRN

## 2021-12-22 MED ORDER — MEPERIDINE HCL 25 MG/ML IJ SOLN: 6.2500 mg | INTRAMUSCULAR | Status: DC | PRN

## 2021-12-22 MED ORDER — ACETAMINOPHEN 325 MG PO TABS
650.0000 mg | ORAL_TABLET | ORAL | Status: DC | PRN
  Administered 2021-12-22 – 2021-12-24 (×5): 650 mg via ORAL
  Filled 2021-12-22 (×5): qty 2

## 2021-12-22 MED ORDER — CEFAZOLIN SODIUM-DEXTROSE 2-4 GM/100ML-% IV SOLN
INTRAVENOUS | Status: AC
  Filled 2021-12-22: qty 200

## 2021-12-22 MED ORDER — NALOXONE HCL 0.4 MG/ML IJ SOLN: 0.4000 mg | INTRAMUSCULAR | Status: DC | PRN

## 2021-12-22 MED ORDER — DEXAMETHASONE SODIUM PHOSPHATE 4 MG/ML IJ SOLN
INTRAMUSCULAR | Status: DC | PRN
  Administered 2021-12-22: 4 mg via INTRAVENOUS

## 2021-12-22 MED ORDER — KETOROLAC TROMETHAMINE 30 MG/ML IJ SOLN
30.0000 mg | Freq: Four times a day (QID) | INTRAMUSCULAR | Status: AC | PRN
  Administered 2021-12-22 (×2): 30 mg via INTRAVENOUS
  Filled 2021-12-22: qty 1

## 2021-12-22 MED ORDER — IBUPROFEN 600 MG PO TABS
600.0000 mg | ORAL_TABLET | Freq: Four times a day (QID) | ORAL | Status: DC
  Administered 2021-12-23 – 2021-12-24 (×6): 600 mg via ORAL
  Filled 2021-12-22 (×6): qty 1

## 2021-12-22 MED ORDER — DIPHENHYDRAMINE HCL 50 MG/ML IJ SOLN
12.5000 mg | INTRAMUSCULAR | Status: DC | PRN
  Administered 2021-12-22: 12.5 mg via INTRAVENOUS
  Filled 2021-12-22: qty 1

## 2021-12-22 MED ORDER — DIPHENHYDRAMINE HCL 25 MG PO CAPS: 25.0000 mg | ORAL_CAPSULE | Freq: Four times a day (QID) | ORAL | Status: DC | PRN

## 2021-12-22 MED ORDER — OXYTOCIN-SODIUM CHLORIDE 30-0.9 UT/500ML-% IV SOLN
INTRAVENOUS | Status: AC
  Filled 2021-12-22: qty 500

## 2021-12-22 MED ORDER — NALOXONE HCL 4 MG/10ML IJ SOLN: 1.0000 ug/kg/h | INTRAVENOUS | Status: DC | PRN

## 2021-12-22 MED ORDER — MONTELUKAST SODIUM 10 MG PO TABS
10.0000 mg | ORAL_TABLET | Freq: Every day | ORAL | Status: DC
  Administered 2021-12-23 (×2): 10 mg via ORAL
  Filled 2021-12-22 (×2): qty 1

## 2021-12-22 MED ORDER — KETOROLAC TROMETHAMINE 30 MG/ML IJ SOLN
INTRAMUSCULAR | Status: AC
  Filled 2021-12-22: qty 1

## 2021-12-22 MED ORDER — CEFAZOLIN SODIUM-DEXTROSE 2-3 GM-%(50ML) IV SOLR
INTRAVENOUS | Status: DC | PRN
  Administered 2021-12-22: 2 g via INTRAVENOUS

## 2021-12-22 MED ORDER — LACTATED RINGERS IV SOLN: INTRAVENOUS | Status: DC | PRN

## 2021-12-22 MED ORDER — ONDANSETRON HCL 4 MG/2ML IJ SOLN: 4.0000 mg | Freq: Three times a day (TID) | INTRAMUSCULAR | Status: DC | PRN

## 2021-12-22 MED ORDER — COCONUT OIL OIL: 1.0000 | TOPICAL_OIL | Status: DC | PRN

## 2021-12-22 MED ORDER — LACTATED RINGERS IV SOLN: INTRAVENOUS | Status: DC

## 2021-12-22 MED ORDER — FENTANYL CITRATE (PF) 100 MCG/2ML IJ SOLN: 25.0000 ug | INTRAMUSCULAR | Status: DC | PRN

## 2021-12-22 MED ORDER — MORPHINE SULFATE (PF) 0.5 MG/ML IJ SOLN
INTRAMUSCULAR | Status: DC | PRN
  Administered 2021-12-22: 3 mg via EPIDURAL

## 2021-12-22 MED ORDER — DIBUCAINE (PERIANAL) 1 % EX OINT: 1.0000 | TOPICAL_OINTMENT | CUTANEOUS | Status: DC | PRN

## 2021-12-22 MED ORDER — SERTRALINE HCL 50 MG PO TABS
50.0000 mg | ORAL_TABLET | Freq: Every day | ORAL | Status: DC
  Administered 2021-12-23 (×2): 50 mg via ORAL
  Filled 2021-12-22 (×2): qty 1

## 2021-12-22 MED ORDER — OXYTOCIN-SODIUM CHLORIDE 30-0.9 UT/500ML-% IV SOLN: 2.5000 [IU]/h | INTRAVENOUS | Status: AC

## 2021-12-22 MED ORDER — PHENYLEPHRINE 80 MCG/ML (10ML) SYRINGE FOR IV PUSH (FOR BLOOD PRESSURE SUPPORT)
PREFILLED_SYRINGE | INTRAVENOUS | Status: AC
  Filled 2021-12-22: qty 10

## 2021-12-22 MED ORDER — ONDANSETRON HCL 4 MG/2ML IJ SOLN: 4.0000 mg | Freq: Four times a day (QID) | INTRAMUSCULAR | Status: DC | PRN

## 2021-12-22 MED ORDER — SODIUM CHLORIDE 0.9% FLUSH
3.0000 mL | INTRAVENOUS | Status: DC | PRN
Start: 2021-12-22 — End: 2021-12-24

## 2021-12-22 MED ORDER — MORPHINE SULFATE (PF) 0.5 MG/ML IJ SOLN
INTRAMUSCULAR | Status: AC
  Filled 2021-12-22: qty 10

## 2021-12-22 MED ORDER — MENTHOL 3 MG MT LOZG: 1.0000 | LOZENGE | OROMUCOSAL | Status: DC | PRN

## 2021-12-22 MED ORDER — WITCH HAZEL-GLYCERIN EX PADS: 1.0000 | MEDICATED_PAD | CUTANEOUS | Status: DC | PRN

## 2021-12-22 MED ORDER — PHENYLEPHRINE HCL (PRESSORS) 10 MG/ML IV SOLN
INTRAVENOUS | Status: DC | PRN
  Administered 2021-12-22 (×2): 120 ug via INTRAVENOUS
  Administered 2021-12-22: 160 ug via INTRAVENOUS
  Administered 2021-12-22: 240 ug via INTRAVENOUS
  Administered 2021-12-22: 80 ug via INTRAVENOUS
  Administered 2021-12-22 (×4): 160 ug via INTRAVENOUS
  Administered 2021-12-22: 240 ug via INTRAVENOUS

## 2021-12-22 MED ORDER — OXYCODONE HCL 5 MG PO TABS: 5.0000 mg | ORAL_TABLET | Freq: Once | ORAL | Status: DC | PRN

## 2021-12-22 MED ORDER — OXYCODONE HCL 5 MG PO TABS
5.0000 mg | ORAL_TABLET | ORAL | Status: DC | PRN
  Administered 2021-12-24 (×2): 5 mg via ORAL
  Filled 2021-12-22 (×2): qty 1

## 2021-12-22 MED ORDER — SENNOSIDES-DOCUSATE SODIUM 8.6-50 MG PO TABS
2.0000 | ORAL_TABLET | Freq: Every day | ORAL | Status: DC
  Administered 2021-12-23 – 2021-12-24 (×2): 2 via ORAL
  Filled 2021-12-22 (×2): qty 2

## 2021-12-22 MED ORDER — SIMETHICONE 80 MG PO CHEW
80.0000 mg | CHEWABLE_TABLET | Freq: Three times a day (TID) | ORAL | Status: DC
  Administered 2021-12-23 – 2021-12-24 (×4): 80 mg via ORAL
  Filled 2021-12-22 (×4): qty 1

## 2021-12-22 MED ORDER — OXYCODONE HCL 5 MG/5ML PO SOLN: 5.0000 mg | Freq: Once | ORAL | Status: DC | PRN

## 2021-12-22 MED ORDER — DEXAMETHASONE SODIUM PHOSPHATE 4 MG/ML IJ SOLN
INTRAMUSCULAR | Status: AC
  Filled 2021-12-22: qty 1

## 2021-12-22 MED ORDER — SODIUM BICARBONATE 8.4 % IV SOLN
INTRAVENOUS | Status: DC | PRN
  Administered 2021-12-22 (×3): 5 mL via EPIDURAL

## 2021-12-22 MED ORDER — ZOLPIDEM TARTRATE 5 MG PO TABS: 5.0000 mg | ORAL_TABLET | Freq: Every evening | ORAL | Status: DC | PRN

## 2021-12-22 MED ORDER — SIMETHICONE 80 MG PO CHEW: 80.0000 mg | CHEWABLE_TABLET | ORAL | Status: DC | PRN

## 2021-12-22 SURGICAL SUPPLY — 34 items
APL SKNCLS STERI-STRIP NONHPOA (GAUZE/BANDAGES/DRESSINGS) ×1
BARRIER ADHS 3X4 INTERCEED (GAUZE/BANDAGES/DRESSINGS) IMPLANT
BENZOIN TINCTURE PRP APPL 2/3 (GAUZE/BANDAGES/DRESSINGS) IMPLANT
BRR ADH 4X3 ABS CNTRL BYND (GAUZE/BANDAGES/DRESSINGS)
CHLORAPREP W/TINT 26ML (MISCELLANEOUS) ×2 IMPLANT
CLAMP CORD UMBIL (MISCELLANEOUS) ×1 IMPLANT
CLOTH BEACON ORANGE TIMEOUT ST (SAFETY) ×1 IMPLANT
DRSG OPSITE POSTOP 4X10 (GAUZE/BANDAGES/DRESSINGS) ×1 IMPLANT
ELECT REM PT RETURN 9FT ADLT (ELECTROSURGICAL) ×1
ELECTRODE REM PT RTRN 9FT ADLT (ELECTROSURGICAL) ×1 IMPLANT
EXTRACTOR VACUUM M CUP 4 TUBE (SUCTIONS) IMPLANT
GLOVE BIO SURGEON STRL SZ 6.5 (GLOVE) ×1 IMPLANT
GLOVE BIOGEL PI IND STRL 7.0 (GLOVE) ×1 IMPLANT
GLOVE BIOGEL PI INDICATOR 7.0 (GLOVE) ×1
GOWN STRL REUS W/TWL LRG LVL3 (GOWN DISPOSABLE) ×2 IMPLANT
KIT ABG SYR 3ML LUER SLIP (SYRINGE) IMPLANT
NDL HYPO 25X5/8 SAFETYGLIDE (NEEDLE) ×1 IMPLANT
NEEDLE HYPO 22GX1.5 SAFETY (NEEDLE) IMPLANT
NEEDLE HYPO 25X5/8 SAFETYGLIDE (NEEDLE) ×1 IMPLANT
NS IRRIG 1000ML POUR BTL (IV SOLUTION) ×1 IMPLANT
PACK C SECTION WH (CUSTOM PROCEDURE TRAY) ×1 IMPLANT
PAD OB MATERNITY 4.3X12.25 (PERSONAL CARE ITEMS) ×1 IMPLANT
STRIP CLOSURE SKIN 1/2X4 (GAUZE/BANDAGES/DRESSINGS) IMPLANT
SUT CHROMIC 0 CTX 36 (SUTURE) ×2 IMPLANT
SUT PLAIN 0 NONE (SUTURE) IMPLANT
SUT PLAIN 2 0 XLH (SUTURE) IMPLANT
SUT VIC AB 0 CT1 27 (SUTURE) ×5
SUT VIC AB 0 CT1 27XBRD ANBCTR (SUTURE) ×3 IMPLANT
SUT VIC AB 4-0 KS 27 (SUTURE) IMPLANT
SYR CONTROL 10ML LL (SYRINGE) IMPLANT
TOWEL OR 17X24 6PK STRL BLUE (TOWEL DISPOSABLE) ×1 IMPLANT
TRAY FOLEY W/BAG SLVR 14FR LF (SET/KITS/TRAYS/PACK) ×1 IMPLANT
VACUUM CUP M-STYLE MYSTIC II (SUCTIONS) IMPLANT
WATER STERILE IRR 1000ML POUR (IV SOLUTION) ×1 IMPLANT

## 2021-12-22 NOTE — Lactation Note (Signed)
This note was copied from a baby's chart. Lactation Consultation Note  Patient Name: Cheyenne Sutton IRCVE'L Date: 12/22/2021 Reason for consult: Initial assessment;Term Age:29 hours Infant had 3 stools and 2 voids since birth. See maternal data below regarding past BF experience. LC did not observe latch, infant recently BF at 1820 pm for 35 minutes, infant latched on both breast, per Birth Parent. Birth Parent will continue to BF infant according to hunger cues, on demand, 8 to 12+ times, STS. Birth Parent knows to ask RN/LC for latch assistance if needed. LC discussed hand expression with breast model. Birth Parent was  made aware of O/P services, breastfeeding support groups, community resources, and our phone # for post-discharge questions.    Maternal Data Has patient been taught Hand Expression?: Yes Does the patient have breastfeeding experience prior to this delivery?: Yes How long did the patient breastfeed?: Per Birth Parent , pumped only 1st child for 6 months, infant had latch difficulties, tongue tie.  Feeding Mother's Current Feeding Choice: Breast Milk  LATCH Score                    Lactation Tools Discussed/Used    Interventions Interventions: Breast feeding basics reviewed;Skin to skin;Position options;Education;LC Services brochure  Discharge Pump: Personal (Per Birth Parent she has DEBP at home.)  Consult Status Consult Status: Follow-up Date: 12/23/21 Follow-up type: In-patient    Danelle Earthly 12/22/2021, 7:06 PM

## 2021-12-22 NOTE — Transfer of Care (Signed)
Immediate Anesthesia Transfer of Care Note  Patient: Cheyenne Sutton  Procedure(s) Performed: CESAREAN SECTION  Patient Location: PACU  Anesthesia Type:Epidural  Level of Consciousness: awake, alert  and oriented  Airway & Oxygen Therapy: Patient Spontanous Breathing  Post-op Assessment: Report given to RN and Post -op Vital signs reviewed and stable  Post vital signs: Reviewed and stable  Last Vitals:  Vitals Value Taken Time  BP 110/59 12/22/21 1030  Temp    Pulse 102 12/22/21 1031  Resp 13 12/22/21 1031  SpO2 100 % 12/22/21 1031  Vitals shown include unvalidated device data.  Last Pain:  Vitals:   12/22/21 0840  TempSrc:   PainSc: 0-No pain         Complications: No notable events documented.

## 2021-12-22 NOTE — Progress Notes (Signed)
Assumed care of this patient at 0730 Patient is resting with epidural FHR Category 1 now  Cervix is 80% 8 cm/puffy -3 vertex  IUPC placed  Last exam at 0545 and unchanged   Prior to that exam was at 2135 and closed at that time.  Discussed with patient that I am concerned about lack of cervical change AND high station Plan to measure IUPC  Recheck in 1 hour and if no change will proceed with Repeat LTCS Risks of continuing labor if no change and risk of uterine rupture again discussed  She agrees with the plan

## 2021-12-22 NOTE — Brief Op Note (Signed)
12/22/2021  10:42 AM  PATIENT:  Cheyenne Sutton  31 y.o. female  PRE-OPERATIVE DIAGNOSIS:   IUP at 18 w 5 days Previous Cesarean Section Failure to Progress  POST-OPERATIVE DIAGNOSIS:   Same  PROCEDURE:  Procedure(s): CESAREAN SECTION (N/A)  SURGEON:  Surgeon(s) and Role:    * Marcelle Overlie, MD - Primary  PHYSICIAN ASSISTANT:   ASSISTANTS: none   ANESTHESIA:   epidural  EBL:  per anesthesia record   BLOOD ADMINISTERED:none  DRAINS: Urinary Catheter (Foley)   LOCAL MEDICATIONS USED:  NONE  SPECIMEN:  No Specimen  DISPOSITION OF SPECIMEN:  N/A  COUNTS:  YES  TOURNIQUET:  * No tourniquets in log *  DICTATION: .Other Dictation: Dictation Number dictated  PLAN OF CARE: Admit to inpatient   PATIENT DISPOSITION:  PACU - hemodynamically stable.   Delay start of Pharmacological VTE agent (>24hrs) due to surgical blood loss or risk of bleeding: not applicable

## 2021-12-22 NOTE — Op Note (Signed)
[name], Cheyenne Sutton MEDICAL RECORD NO: [id number] ACCOUNT NO: [id number] DATE OF BIRTH: 1990/11/21 FACILITY: [facility] LOCATION: [facility] PHYSICIAN: Itzayanna Kaster L. Vincente Poli, MD  Operative Report   DATE OF PROCEDURE: 12/22/2021  PREOPERATIVE DIAGNOSES: 1.  Intrauterine pregnancy at 39 weeks and 5 days. 2.  Previous cesarean section. 3.  Failure to progress.  POSTOPERATIVE DIAGNOSES: 1.  Intrauterine pregnancy at 39 weeks and 5 days. 2.  Previous cesarean section. 3.  Failure to progress.  PROCEDURE:  Repeat low transverse cesarean section.  SURGEON:  Keiosha Cancro L. Vincente Poli, MD.  ANESTHESIA:  Epidural.  ESTIMATED BLOOD LOSS:  Per anesthesia record.  COMPLICATIONS:  None.  DRAINS:  Foley catheter.  DESCRIPTION OF PROCEDURE:  The patient was taken to the operating room.  She was dosed appropriately.  Timeout was performed.  She was prepped and draped and Foley catheter was already in and draining urine.  Allis test was performed.  A low transverse  incision was made with a scalpel and carried down to the fascia.  Fascia was scored in the midline and the incision was extended.  The rectus muscles were divided in the midline.  The peritoneum was entered bluntly.  The peritoneal incision was then  stretched.  A bladder flap was created and then the bladder blade was inserted.  A low transverse incision was made in the uterus.  Uterus was entered using a hemostat.  Amniotic fluid was clear.  The baby was in transverse position was delivered easily,  was a female infant, Apgars 9 at 1 minute and 9 at 5 minutes.  The cord was clamped and cut.  The baby was handed to the waiting neonatal team.  Uterus was exteriorized.  The placenta was delivered easily, spontaneously and was noted to be intact.  The  uterus was cleared of all clots and debris.  The adnexa were normal.  Uterine incision was closed in 2 layers using 0 chromic in a running locked stitch.  The uterus was hemostatic.  The uterus was returned to  the abdomen.  Irrigation was performed.   Hemostasis was very good.  The peritoneum was closed using 0 Vicryl and the rectus muscles were reapproximated using 0 Vicryl.  The fascia was closed using 0 Vicryl in a running stitch and the skin was closed with 3-0 Vicryl on a Keith needle.  Benzoin,  Steri-Strips, and a honeycomb dressing were applied.  All sponge, lap and instrument counts were correct x2.  The patient went to recovery room.   PUS D: 12/22/2021 10:57:50 am T: 12/22/2021 8:02:00 pm  JOB: [id number]/ [id number]

## 2021-12-22 NOTE — Anesthesia Postprocedure Evaluation (Signed)
Anesthesia Post Note  Patient: Cheyenne Sutton  Procedure(s) Performed: CESAREAN SECTION     Patient location during evaluation: Mother Baby Anesthesia Type: Epidural Level of consciousness: awake and alert Pain management: pain level controlled Vital Signs Assessment: post-procedure vital signs reviewed and stable Respiratory status: spontaneous breathing, nonlabored ventilation and respiratory function stable Cardiovascular status: stable Postop Assessment: no headache, no backache and epidural receding Anesthetic complications: no   No notable events documented.  Last Vitals:  Vitals:   12/22/21 1115 12/22/21 1256  BP: 118/69 96/62  Pulse:  84  Resp: (!) 23   Temp:  36.5 C  SpO2: 100% 99%    Last Pain:  Vitals:   12/22/21 1256  TempSrc: Oral  PainSc:    Pain Goal:                   Bre Pecina

## 2021-12-22 NOTE — Progress Notes (Signed)
Repeat exam  No cervical change FHR now repetitive decels with each contraction   Recommend proceed with Repeat LTCS Risks are reviewed Patient agrees Consent signed Suzette Battiest in OR notified

## 2021-12-23 LAB — CBC
HCT: 29.4 % — ABNORMAL LOW (ref 36.0–46.0)
Hemoglobin: 9.9 g/dL — ABNORMAL LOW (ref 12.0–15.0)
MCH: 29.7 pg (ref 26.0–34.0)
MCHC: 33.7 g/dL (ref 30.0–36.0)
MCV: 88.3 fL (ref 80.0–100.0)
Platelets: 127 10*3/uL — ABNORMAL LOW (ref 150–400)
RBC: 3.33 MIL/uL — ABNORMAL LOW (ref 3.87–5.11)
RDW: 14 % (ref 11.5–15.5)
WBC: 11.6 10*3/uL — ABNORMAL HIGH (ref 4.0–10.5)
nRBC: 0 % (ref 0.0–0.2)

## 2021-12-23 NOTE — Social Work (Signed)
MOB was referred for history of depression/anxiety.  * Referral screened out by Clinical Social Worker because none of the following criteria appear to apply:  ~ History of anxiety/depression during this pregnancy, or of post-partum depression following prior delivery.  ~ Diagnosis of anxiety and/or depression within last 3 years OR * MOB's symptoms currently being treated with medication and/or therapy.  Per chart review MOB is actively taking Sertraline and reports that it is beneficial to her mood. No noted symptoms throughout pregnancy.   Please contact the Clinical Social Worker if needs arise, by MOB request, or if MOB scores greater than 9/yes to question 10 on Edinburgh Postpartum Depression Screen.   Sonita Michiels, LCSWA Clinical Social Worker [phone] 

## 2021-12-23 NOTE — Progress Notes (Signed)
Subjective: Postpartum Day 1: Cesarean Delivery Patient reports tolerating PO and no problems voiding.    Objective: Vital signs in last 24 hours: Temp:  [97.7 F (36.5 C)-98.2 F (36.8 C)] 98.1 F (36.7 C) (08/24 0651) Pulse Rate:  [81-104] 81 (08/24 0651) Resp:  [16-23] 16 (08/24 0651) BP: (96-118)/(59-79) 109/72 (08/24 0651) SpO2:  [99 %-100 %] 100 % (08/24 2505)  Physical Exam:  General: alert, cooperative, and appears stated age Lochia: appropriate Uterine Fundus: firm Incision: healing well, no significant drainage, no dehiscence DVT Evaluation: No evidence of DVT seen on physical exam. Negative Homan's sign. No cords or calf tenderness.  Recent Labs    12/21/21 1535 12/23/21 0527  HGB 12.7 9.9*  HCT 37.3 29.4*    Assessment/Plan: Status post Cesarean section. Doing well postoperatively.  Continue current care.  Mitchel Honour, DO 12/23/2021, 10:09 AM

## 2021-12-23 NOTE — Lactation Note (Signed)
This note was copied from a baby's chart. Lactation Consultation Note  Patient Name: Cheyenne Sutton IRCVE'L Date: 12/23/2021 Reason for consult: Follow-up assessment;1st time breastfeeding;Term;Infant weight loss (-4% weight loss) Age:28 hours Infant had void diaper while LC was in the room, infant had 3 voids and 6 stools since birth. LC entered the room, birth parent had infant latched on the left breast using the cradle hold position, LC reviewed breast stimulation techniques to keep infant actively BF and not sleeping or hang out at the breast. LC ask Birth Parent to unwrap infant (blanket) and BF infant STS, Birth Parent was open to [facility] suggestion. Infant was still BF after 15 minutes when LC left the room. Per Birth Parent, she has been experiencing cramping when infant is latching at the breast. Birth Parent will continue to latch infant on both breast during a feeding if infant is still cuing after latching on the 1st breast, continue to BF by cues, 8 to 12+ times within 24 hours, Birth Parent knows she can hand express and give infant extra volume after latching infant at breast due to cluster feeding. Birth Parent know to call RN/LC if their are any BF questions, concerns or need further latch assistance.  Maternal Data    Feeding Mother's Current Feeding Choice: Breast Milk  LATCH Score Latch: Grasps breast easily, tongue down, lips flanged, rhythmical sucking.  Audible Swallowing: Spontaneous and intermittent  Type of Nipple: Everted at rest and after stimulation  Comfort (Breast/Nipple): Soft / non-tender  Hold (Positioning): Assistance needed to correctly position infant at breast and maintain latch. (LC reviewed breast stimulation techniques to keep infant awake and actively feeding at the breast such as: breast compressions, gently stroking infant's neck, shoudler and hands.)  LATCH Score: 9   Lactation Tools Discussed/Used    Interventions Interventions:  Skin to skin;Breast compression;Education  Discharge    Consult Status      Danelle Earthly 12/23/2021, 6:11 PM

## 2021-12-24 ENCOUNTER — Ambulatory Visit: Admit: 2021-12-24 | Payer: Managed Care, Other (non HMO)

## 2021-12-24 ENCOUNTER — Ambulatory Visit
Admission: EM | Admit: 2021-12-24 | Discharge: 2021-12-24 | Disposition: A | Discharge status: Home / Self Care | Payer: Managed Care, Other (non HMO) | Attending: Emergency Medicine | Admitting: Emergency Medicine

## 2021-12-24 DIAGNOSIS — L02411 Cutaneous abscess of right axilla: Secondary | ICD-10-CM

## 2021-12-24 DIAGNOSIS — L732 Hidradenitis suppurativa: Secondary | ICD-10-CM

## 2021-12-24 MED ORDER — ACETAMINOPHEN 325 MG PO TABS: 650.0000 mg | ORAL_TABLET | Freq: Four times a day (QID) | ORAL | 0 refills | Status: AC | PRN

## 2021-12-24 MED ORDER — CEFDINIR 300 MG PO CAPS: 300.0000 mg | ORAL_CAPSULE | Freq: Two times a day (BID) | ORAL | 0 refills | Status: AC

## 2021-12-24 MED ORDER — OXYCODONE HCL 5 MG PO TABS: 5.0000 mg | ORAL_TABLET | Freq: Four times a day (QID) | ORAL | 0 refills | Status: DC | PRN

## 2021-12-24 NOTE — Discharge Instructions (Addendum)
During your visit today, we attempted to open and drain the lesion under your right arm caused by hidradenitis suppurativa.  Unfortunately, because there appeared to be multiple pockets, only 1 pocket was well drained.  As result, I also provided you with an injection of lidocaine and Kenalog.  Lidocaine will help the inside of your lesion heal and hopefully provide you with some benefits of numbing.  Kenalog is a steroid which will reduce the inflammation and help the lesion heal faster as well.  I would also like for you to take cefdinir 1 capsule twice daily for the next 5 days.  I provided you with a 7-day prescription that you are welcome to finish if you feel that the 5 days of cefdinir was not quite long enough.  After 7 days, if you feel like you still have not had complete relief of your pain and swelling, please feel free to return for repeat evaluation.  Please consider reaching out to a dermatologist for further management of your HS.  Concha Norway is a very good dermatology PA, however provided you with her contact information if you decide to reach out to her for follow-up.  Please keep your wound covered until it is no longer draining.  I provided you with provisions for this.  I recommend that you change the bandage daily.  Thank you for visiting urgent care today.  Thank you for your patience.    Congratulations to you for the birth of your beautiful new daughter!

## 2021-12-24 NOTE — Lactation Note (Signed)
This note was copied from a baby's chart. Lactation Consultation Note  Patient Name: Cheyenne Sutton GYJEH'U Date: 12/24/2021 Reason for consult: Follow-up assessment;Term Age:29 hours  LC in to room prior to discharge. Birthing parent is pumping and states infant is latching well, they are supplementing per MD recommendation due to weight loss. Parent is supplementing using a curved tip syringe. LC demonstrated/encouraged use of 39F feeding tube with syringe instead of curved tip as it may ease transfer to larger volume and/or at breast supplementation. Parents are familiar with technique, they explained they used it with first child. Reviewed have volume guidelines when supplementing after breastfeeding. Discussed normal behavior and patterns after 24h. Talked about milk coming into volume and managing engorgement.   Plan: 1-Feeding on demand or 8-12 times in 24h period. 2-Hand express/pump as needed for supplementation 3-Encouraged maternal rest, hydration and food intake.   Contact LC as needed for feeds/support/concerns/questions. All questions answered at this time. Reviewed LC brochure.     Feeding Mother's Current Feeding Choice: Breast Milk and Formula  LATCH Score Did not observe   Lactation Tools Discussed/Used Tools: Pump;39F feeding tube / Syringe Breast pump type: Double-Electric Breast Pump;Manual Reason for Pumping: stimulation and supplementation Pumping frequency: as needed Pumped volume: 5 mL  Interventions Interventions: Expressed milk;Education;Breast feeding basics reviewed;LC Services brochure;DEBP  Discharge Discharge Education: Engorgement and breast care;Warning signs for feeding baby Pump: Personal;Manual;DEBP WIC Program: Yes  Consult Status Consult Status: Complete Date: 12/24/21 Follow-up type: Call as needed    Farhad Burleson A Higuera Ancidey 12/24/2021, 11:22 AM

## 2021-12-24 NOTE — Discharge Summary (Signed)
Postpartum Discharge Summary  Date of Service updated 12/24/21     Patient Name: Cheyenne Sutton DOB: [birth year] MRN: [id number]  Date of admission: 12/21/2021 Delivery date:12/22/2021  Delivering provider: Dian Queen  Date of discharge: 12/24/2021  Admitting diagnosis: Encounter for induction of labor [Z34.90] S/P cesarean section [Z98.891] Intrauterine pregnancy: [redacted]w[redacted]d    Secondary diagnosis:  Principal Problem:   Encounter for induction of labor Active Problems:   S/P cesarean section  Additional problems:     Discharge diagnosis: Term Pregnancy Delivered                                              Post partum procedures: Augmentation: Pitocin Complications: None  Hospital course: Onset of Labor With Unplanned C/S   31y.o. yo [id number] 330w5das admitted in Active Labor on 12/21/2021. Patient had a labor course significant for previous cesarean sections. The patient went for cesarean section due to Arrest of Dilation. Delivery details as follows: Membrane Rupture Time/Date: 5:45 AM ,12/22/2021   Delivery Method:C-Section, Low Transverse  Details of operation can be found in separate operative note. Patient had an uncomplicated postpartum course.  She is ambulating,tolerating a regular diet, passing flatus, and urinating well.  Patient is discharged home in stable condition 12/24/21.  Newborn Data: Birth date:12/22/2021  Birth time:9:45 AM  Gender:Female  Living status:Living  Apgars:8 ,9  Weight:2900 g   Magnesium Sulfate received: No BMZ received: No Rhophylac:No MMR:No T-DaP:Given prenatally Flu: No Transfusion:No  Physical exam  Vitals:   12/23/21 0651 12/23/21 1415 12/23/21 2100 12/24/21 0520  BP: 109/72 104/73 105/67 116/75  Pulse: 81 84 (!) 53 93  Resp: '16  17 18  ' Temp: 98.1 F (36.7 C) 97.9 F (36.6 C) 99.7 F (37.6 C) 98.4 F (36.9 C)  TempSrc: Oral Oral  Oral  SpO2: 100%  99%   Weight:      Height:       General: alert, cooperative,  and no distress Lochia: appropriate Uterine Fundus: firm Incision: Healing well with no significant drainage DVT Evaluation: No evidence of DVT seen on physical exam. Labs: Lab Results  Component Value Date   WBC 11.6 (H) 12/23/2021   HGB 9.9 (L) 12/23/2021   HCT 29.4 (L) 12/23/2021   MCV 88.3 12/23/2021   PLT 127 (L) 12/23/2021      Latest Ref Rng & Units 11/17/2018    4:11 AM  CMP  Creatinine 0.44 - 1.00 mg/dL 0.80    Edinburgh Score:    12/24/2021   10:27 AM  Edinburgh Postnatal Depression Scale Screening Tool  I have been able to laugh and see the funny side of things. 0  I have looked forward with enjoyment to things. 0  I have blamed myself unnecessarily when things went wrong. 1  I have been anxious or worried for no good reason. 1  I have felt scared or panicky for no good reason. 1  Things have been getting on top of me. 1  I have been so unhappy that I have had difficulty sleeping. 0  I have felt sad or miserable. 0  I have been so unhappy that I have been crying. 0  The thought of harming myself has occurred to me. 0  Edinburgh Postnatal Depression Scale Total 4      After visit meds:  Allergies as of  12/24/2021   No Known Allergies      Medication List     STOP taking these medications    albuterol 108 (90 Base) MCG/ACT inhaler Commonly known as: VENTOLIN HFA   montelukast 10 MG tablet Commonly known as: SINGULAIR   senna-docusate 8.6-50 MG tablet Commonly known as: Senokot-S   sertraline 50 MG tablet Commonly known as: ZOLOFT       TAKE these medications    acetaminophen 325 MG tablet Commonly known as: TYLENOL Take 2 tablets (650 mg total) by mouth every 6 (six) hours as needed for mild pain (temperature > 101.5.).   ibuprofen 800 MG tablet Commonly known as: ADVIL Take 1 tablet (800 mg total) by mouth every 8 (eight) hours as needed.   oxyCODONE 5 MG immediate release tablet Commonly known as: Oxy IR/ROXICODONE Take 1 tablet (5  mg total) by mouth every 6 (six) hours as needed for moderate pain.   prenatal multivitamin Tabs tablet Take 1 tablet by mouth daily at 12 noon.         Discharge home in stable condition Infant Feeding: Breast Infant Disposition:home with mother Discharge instruction: per After Visit Summary and Postpartum booklet. Activity: Advance as tolerated. Pelvic rest for 6 weeks.  Diet: routine diet Anticipated Birth Control: Unsure Postpartum Appointment:6 weeks Additional Postpartum F/U:  Future Appointments:No future appointments. Follow up Visit:      12/24/2021 Allena Katz, MD

## 2021-12-24 NOTE — ED Provider Notes (Incomplete)
[facility]    CSN: [id number] Arrival date & time: 12/24/21  1909    HISTORY   Chief Complaint  Patient presents with   Abscess   HPI Cheyenne Sutton is a pleasant, 31 y.o. female who presents to urgent care today. Patient presents to urgent care today hours after being discharged from labor and delivery where she had a C-section delivery of her daughter.  Patient states that prior to and all throughout her time in the hospital she had a significantly enlarged abscess under her right axilla that was ignored by medical staff during her stay at [facility].  Patient states the area is incredibly painful, has difficulty lowering her right arm due to the size of the abscess making caring for her new infant nearly impossible.  Patient is here today hoping to have the lesion incised and drained.  Patient states she asked nursing staff and medical providers multiple times for assistance with this issue and was advised that this kind of medical care was not provided in the labor and delivery unit.    The history is provided by the patient.   Past Medical History:  Diagnosis Date   Anxiety    Asthma    Depression    Folliculitis    Hernia    Hydradenitis    IBS (irritable bowel syndrome)    Panic attacks    Patient Active Problem List   Diagnosis Date Noted   S/P cesarean section 12/22/2021   Encounter for induction of labor 12/21/2021   Status post primary low transverse cesarean section 11/17/2018   Abnormal fetal ultrasound 11/16/2018   Oligohydramnios 11/16/2018   Past Surgical History:  Procedure Laterality Date   CESAREAN SECTION N/A 11/16/2018   Procedure: CESAREAN SECTION;  Surgeon: Conan Bowens, MD;  Location: [facility] LD ORS;  Service: Obstetrics;  Laterality: N/A;   CESAREAN SECTION N/A 12/22/2021   Procedure: CESAREAN SECTION;  Surgeon: Marcelle Overlie, MD;  Location: [facility] LD ORS;  Service: Obstetrics;  Laterality: N/A;   HERNIA REPAIR     OB History      Gravida  3   Para  2   Term  2   Preterm      AB  1   Living  2      SAB      IAB  1   Ectopic      Multiple  0   Live Births  2          Home Medications    Prior to Admission medications   Medication Sig Start Date End Date Taking? Authorizing Provider  acetaminophen (TYLENOL) 325 MG tablet Take 2 tablets (650 mg total) by mouth every 6 (six) hours as needed for mild pain (temperature > 101.5.). 12/24/21   Harold Hedge, MD  ibuprofen (ADVIL) 800 MG tablet Take 1 tablet (800 mg total) by mouth every 8 (eight) hours as needed. 11/19/18   Gerrit Heck, CNM  Prenatal Vit-Fe Fumarate-FA (PRENATAL MULTIVITAMIN) TABS tablet Take 1 tablet by mouth daily at 12 noon.    [provider]    Family History History reviewed. No pertinent family history. Social History Social History   Tobacco Use   Smoking status: Never   Smokeless tobacco: Never  Vaping Use   Vaping Use: Never used  Substance Use Topics   Alcohol use: Not Currently   Drug use: Not Currently    Types: Marijuana    Comment: previous marijuana use-reports none since finding  out about pregnancy   Allergies   Patient has no known allergies.  Review of Systems Review of Systems Pertinent findings revealed after performing a 14 point review of systems has been noted in the history of present illness.  Physical Exam Triage Vital Signs ED Triage Vitals  Enc Vitals Group     BP 02/26/21 0827 (!) 147/82     Pulse Rate 02/26/21 0827 72     Resp 02/26/21 0827 18     Temp 02/26/21 0827 98.3 F (36.8 C)     Temp Source 02/26/21 0827 Oral     SpO2 02/26/21 0827 98 %     Weight --      Height --      Head Circumference --      Peak Flow --      Pain Score 02/26/21 0826 5     Pain Loc --      Pain Edu? --      Excl. in GC? --   No data found.  Updated Vital Signs BP 122/78 (BP Location: Left Arm)   Pulse (!) 103   Temp 99.1 F (37.3 C) (Oral)   Resp 20   SpO2 97%   Breastfeeding  Yes Comment: patient recently gave birth  Physical Exam Vitals and nursing note reviewed.  Constitutional:      General: She is not in acute distress.    Appearance: Normal appearance.  HENT:     Head: Normocephalic and atraumatic.  Eyes:     Pupils: Pupils are equal, round, and reactive to light.  Cardiovascular:     Rate and Rhythm: Normal rate and regular rhythm.  Pulmonary:     Effort: Pulmonary effort is normal.     Breath sounds: Normal breath sounds.  Musculoskeletal:        General: Normal range of motion.     Cervical back: Normal range of motion and neck supple.  Skin:    General: Skin is warm and dry.     Findings: Lesion (7 cm x 5 cm abscess under right axilla with erythema, surrounding induration, diffusely tender to palpation, fluctuant in the center, multiple loculations appreciated with palpation) present.  Neurological:     General: No focal deficit present.     Mental Status: She is alert and oriented to person, place, and time. Mental status is at baseline.  Psychiatric:        Mood and Affect: Mood normal.        Behavior: Behavior normal.        Thought Content: Thought content normal.        Judgment: Judgment normal.     Visual Acuity Right Eye Distance:   Left Eye Distance:   Bilateral Distance:    Right Eye Near:   Left Eye Near:    Bilateral Near:     UC Couse / Diagnostics / Procedures:     Radiology No results found.  Procedures Incision and Drainage  Date/Time: 12/24/2021 7:39 PM  Performed by: Theadora Rama Scales, PA-C Authorized by: Theadora Rama Scales, PA-C   Consent:    Consent obtained:  Verbal   Consent given by:  Patient   Risks, benefits, and alternatives were discussed: yes     Risks discussed:  Bleeding, damage to other organs, incomplete drainage, infection and pain   Alternatives discussed:  No treatment, delayed treatment, alternative treatment, observation and referral Universal protocol:    Procedure  explained and questions answered to patient or proxy's  satisfaction: yes     Patient identity confirmed:  Verbally with patient and arm band Location:    Type:  Abscess   Size:  7 cm x 5 cm   Location:  Upper extremity   Upper extremity location: right axilla. Pre-procedure details:    Skin preparation:  Chlorhexidine Sedation:    Sedation type:  None Anesthesia:    Anesthesia method:  Local infiltration   Local anesthetic:  Lidocaine 1% w/o epi (0.75 cc of lidocaine mixed with 0.25 cc of Kenalog 40 mg/mL injected into the lesion following stab incision) Procedure type:    Complexity:  Simple Procedure details:    Ultrasound guidance: no     Needle aspiration: no     Incision types:  Stab incision   Incision depth:  Subcutaneous   Drainage:  Purulent   Drainage amount:  Moderate   Wound treatment:  Wound left open   Packing materials:  None Post-procedure details:    Procedure completion:  Tolerated  (including critical care time) EKG  Pending results:  Labs Reviewed - No data to display  Medications Ordered in UC: Medications - No data to display  UC Diagnoses / Final Clinical Impressions(s)   I have reviewed the triage vital signs and the nursing notes.  Pertinent labs & imaging results that were available during my care of the patient were reviewed by me and considered in my medical decision making (see chart for details).    Final diagnoses:  Hidradenitis suppurativa of right axilla  Abscess of right axilla   I am having a hard time understanding how providing holistic care to a new mother would not be the prime directive providing best care during a woman's labor and delivery.  I advised patient that I am happy to address her issue so that she can go home, recover from her C-section and care for her new infant.  In my professional opinion, I decided to not aggressively attempt to thoroughly deloculate the entire lesion which typically resulted in incomplete  drainage of entire lesion.  Based on my physical exam findings, I believe there are deeper lesions  which I have elected to defer incision and drainage to dermatology due to concerns over worsening her known hidradenitis suppurativa.    Per my observation, right axillary lesion was sufficiently drained to provide patient with enough comfort so that she can hold and bond her with her infant without pain.    This entire, straightforward procedure took less than 15 minutes to complete and could easily have been performed by any provider on the L&D unit.  Doing so would have helped this patient avoid visiting another medical facility after being discharged from L&D and exposure to multiple known sick contacts here at Urgent Care.  Patient was advised to begin cefdinir and perform daily dressing changes until no longer seeing any drainage on the bandaging.  Patient advised to return for reevaluation if area is incompletely drained.  Patient was also provided with contact information for dermatologist for regular follow-up of hidradenitis suppurativa.  ED Prescriptions     Medication Sig Dispense Auth. Provider   cefdinir (OMNICEF) 300 MG capsule Take 1 capsule (300 mg total) by mouth 2 (two) times daily for 7 days. 14 capsule Theadora RamaMorgan, Bryden Darden Scales, PA-C      PDMP not reviewed this encounter.  Pending results:  Labs Reviewed - No data to display  Discharge Instructions:   Discharge Instructions      During your visit today, we  attempted to open and drain the lesion under your right arm caused by hidradenitis suppurativa.  Unfortunately, because there appeared to be multiple pockets, only 1 pocket was well drained.  As result, I also provided you with an injection of lidocaine and Kenalog.  Lidocaine will help the inside of your lesion heal and hopefully provide you with some benefits of numbing.  Kenalog is a steroid which will reduce the inflammation and help the lesion heal faster as  well.  I would also like for you to take cefdinir 1 capsule twice daily for the next 5 days.  I provided you with a 7-day prescription that you are welcome to finish if you feel that the 5 days of cefdinir was not quite long enough.  After 7 days, if you feel like you still have not had complete relief of your pain and swelling, please feel free to return for repeat evaluation.  Please consider reaching out to a dermatologist for further management of your HS.  Concha Norway is a very good dermatology PA, however provided you with her contact information if you decide to reach out to her for follow-up.  Please keep your wound covered until it is no longer draining.  I provided you with provisions for this.  I recommend that you change the bandage daily.  Thank you for visiting urgent care today.  Thank you for your patience.    Congratulations to you for the birth of your beautiful new daughter!      Disposition Upon Discharge:  Condition: stable for discharge home  Patient presented with an acute illness with associated systemic symptoms and significant discomfort requiring urgent management. In my opinion, this is a condition that a prudent lay person (someone who possesses an average knowledge of health and medicine) may potentially expect to result in complications if not addressed urgently such as respiratory distress, impairment of bodily function or dysfunction of bodily organs.   Routine symptom specific, illness specific and/or disease specific instructions were discussed with the patient and/or caregiver at length.   As such, the patient has been evaluated and assessed, work-up was performed and treatment was provided in alignment with urgent care protocols and evidence based medicine.  Patient/parent/caregiver has been advised that the patient may require follow up for further testing and treatment if the symptoms continue in spite of treatment, as clinically indicated and  appropriate.  Patient/parent/caregiver has been advised to return to the [facility] or PCP if no better; to PCP or the Emergency Department if new signs and symptoms develop, or if the current signs or symptoms continue to change or worsen for further workup, evaluation and treatment as clinically indicated and appropriate  The patient will follow up with their current PCP if and as advised. If the patient does not currently have a PCP we will assist them in obtaining one.   The patient may need specialty follow up if the symptoms continue, in spite of conservative treatment and management, for further workup, evaluation, consultation and treatment as clinically indicated and appropriate.   Patient/parent/caregiver verbalized understanding and agreement of plan as discussed.  All questions were addressed during visit.  Please see discharge instructions below for further details of plan.  This office note has been dictated using Teaching laboratory technician.  Unfortunately, this method of dictation can sometimes lead to typographical or grammatical errors.  I apologize for your inconvenience in advance if this occurs.  Please do not hesitate to reach out to me if clarification  is needed.      Theadora Rama Scales, PA-C 12/25/21 0856    Theadora Rama Scales, PA-C 12/25/21 [phone]

## 2021-12-24 NOTE — Progress Notes (Signed)
Subjective: Postpartum Day 2: Cesarean Delivery Patient reports tolerating PO, + flatus, and no problems voiding.   C/O recurrent right axillary abscess with known history of hidradenitis Objective: Vital signs in last 24 hours: Temp:  [97.9 F (36.6 C)-99.7 F (37.6 C)] 98.4 F (36.9 C) (08/25 0520) Pulse Rate:  [53-93] 93 (08/25 0520) Resp:  [16-18] 18 (08/25 0520) BP: (104-116)/(67-75) 116/75 (08/25 0520) SpO2:  [99 %-100 %] 99 % (08/24 2100)  Physical Exam:  General: alert, cooperative, and no distress Lochia: appropriate Uterine Fundus: firm Incision: healing well DVT Evaluation: No evidence of DVT seen on physical exam. 3 cm right axillary mass  Recent Labs    12/21/21 1535 12/23/21 0527  HGB 12.7 9.9*  HCT 37.3 29.4*    Assessment/Plan: Status post Cesarean section. Doing well postoperatively.  Discharge home with standard precautions and return to clinic in 4-6 weeks Instructions given. Patient requests I&D of recurrent right axillary hidradenitis. She wants to go home today and will have I&D at outpatient clinic if needed. Will consult general surgery. Roselle Locus II, MD 12/24/2021, 6:49 AM

## 2021-12-24 NOTE — ED Triage Notes (Signed)
The patient c/o abscess to her right armpit that appeared around Monday.   The patient states she is breast feeding.

## 2021-12-30 ENCOUNTER — Telehealth (HOSPITAL_COMMUNITY): Payer: Self-pay | Admitting: *Deleted

## 2021-12-30 NOTE — Telephone Encounter (Signed)
Hospital Discharge Follow-Up Call:  Patient reports that she is well and has no concerns about her healing process.  EPDS today was 4 and she endorses this accurately reflects that she is doing well emotionally.  Patient says that baby is well and she has no concerns about baby's health.  She reports that baby sleeps in a bassinet in her bedroom.  Reviewed ABCs of Safe Sleep.

## 2022-01-19 IMAGING — MR MR LUMBAR SPINE W/O CM
4 of 5 series · 27 of 48 positions shown · non-contrast
Comparison: No prior MRI, correlation is made with 12/04/2019
lumbar spine radiographs

CLINICAL DATA: Left-sided back pain with left foot and leg pain

EXAM:
MRI LUMBAR SPINE WITHOUT CONTRAST
TECHNIQUE: Multiplanar, multisequence MR imaging of the lumbar spine was
performed. No intravenous contrast was administered.

[Series 3: T2 · sagittal · 4.0mm · 1.09mm/px · 6 of 17 slices shown (1 of 2)]
[im 1/17]
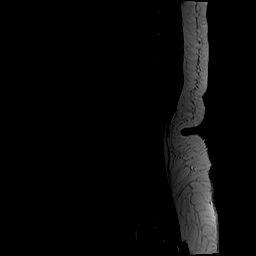
[im 4/17]
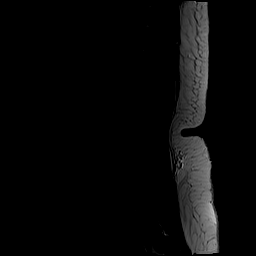
[im 7/17]
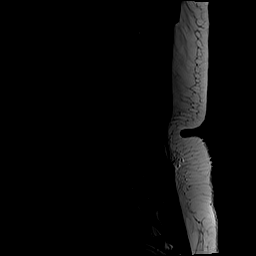
[im 10/17]
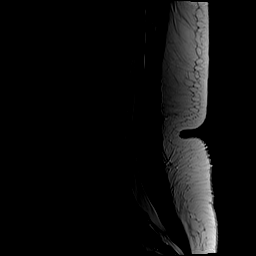
[im 13/17]
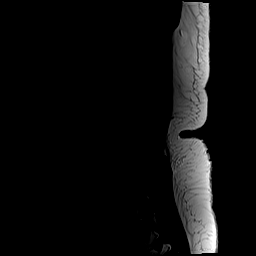
[im 17/17]
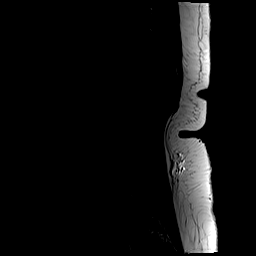

[Series 5: T1 · sagittal · 4.0mm · 1.09mm/px · 6 of 17 slices shown (1 of 2)]
[im 1/17]
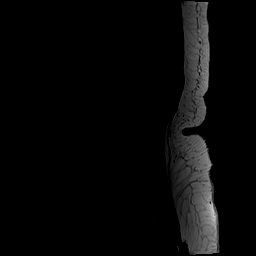
[im 4/17]
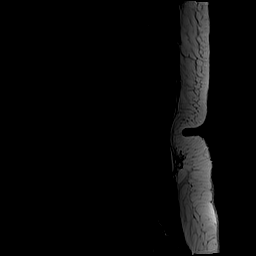
[im 7/17]
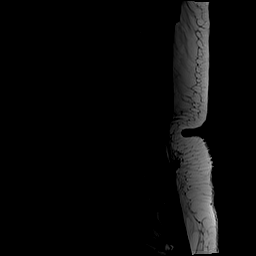
[im 10/17]
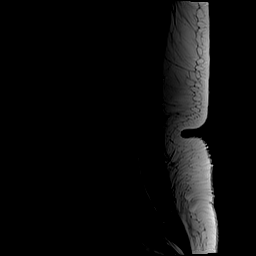
[im 13/17]
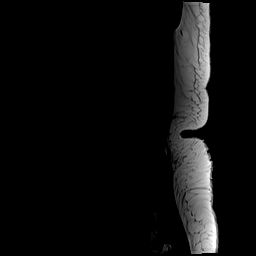
[im 17/17]
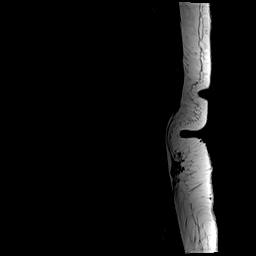

[Series 6: T2 · axial · 4.0mm · 0.39mm/px · z∈[-16,+179]mm · 9 of 39 slices shown (2 of 2)]
[im 1/39]
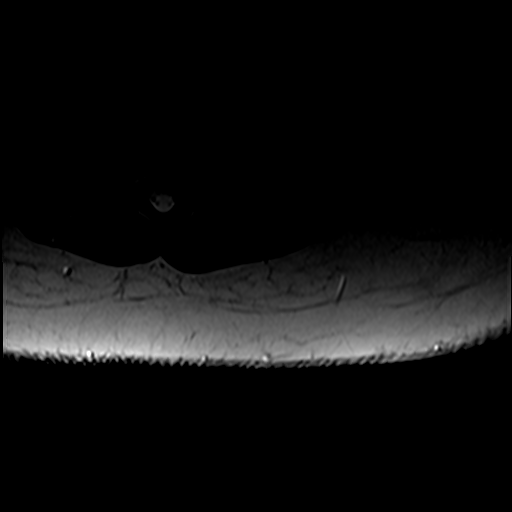
[im 6/39]
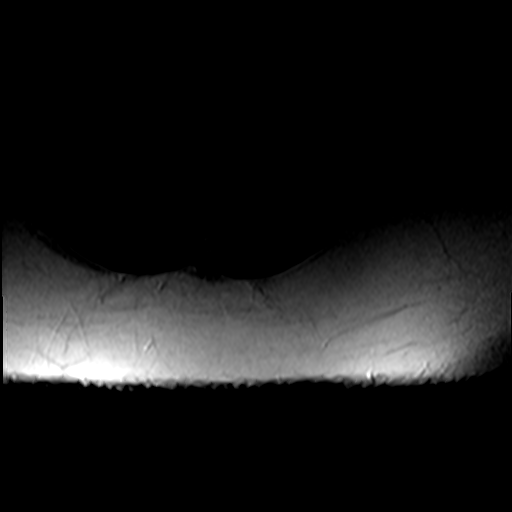
[im 11/39]
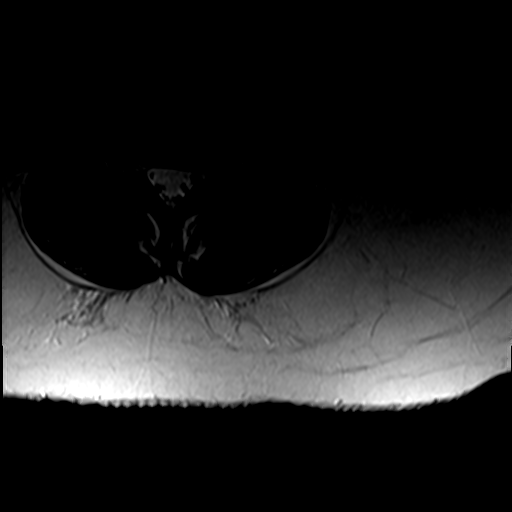
[im 17/39]
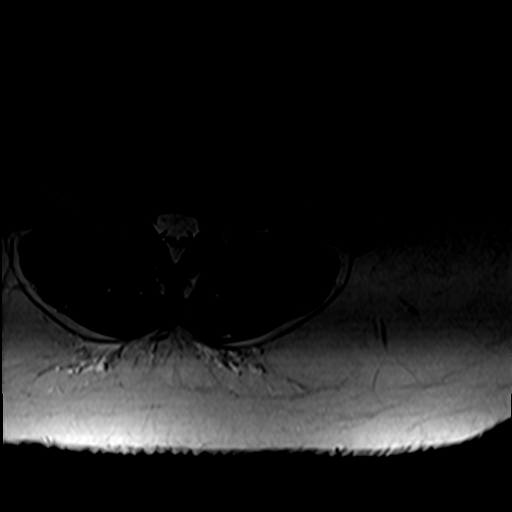
[im 20/39]
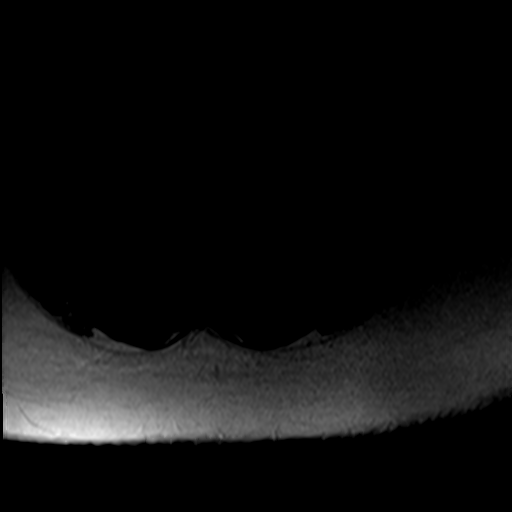
[im 22/39]
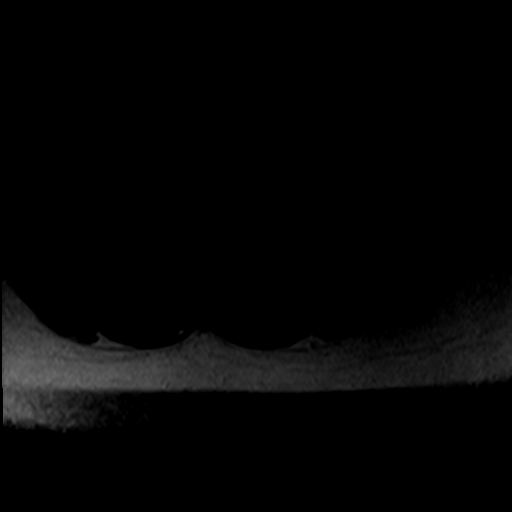
[im 28/39]
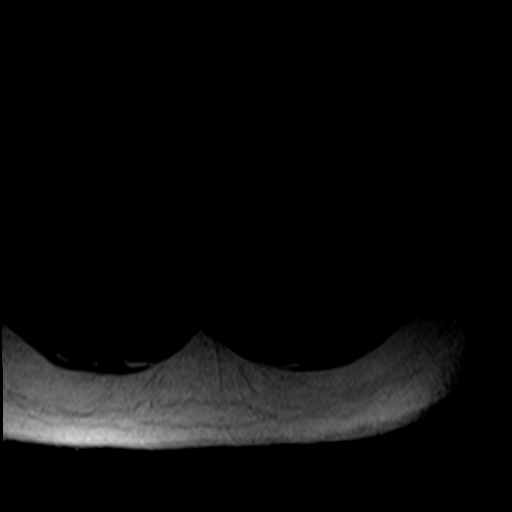
[im 33/39]
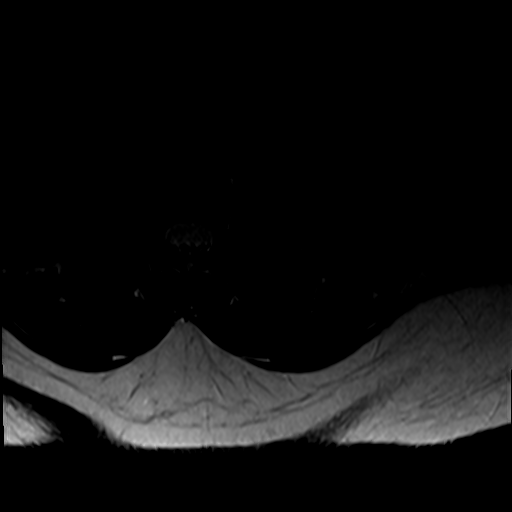
[im 39/39]
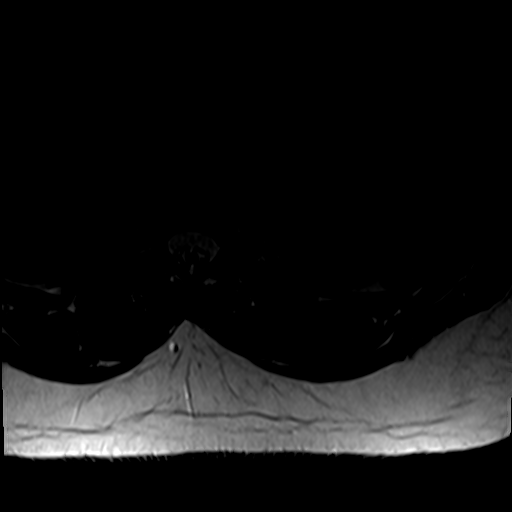

[Series 7: T1 · axial · 4.0mm · 0.39mm/px · z∈[-16,+150]mm · 6 of 39 slices shown (2 of 2)]
[im 1/39]
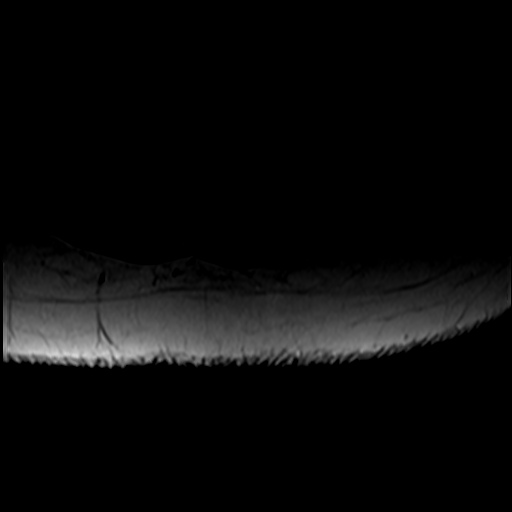
[im 6/39]
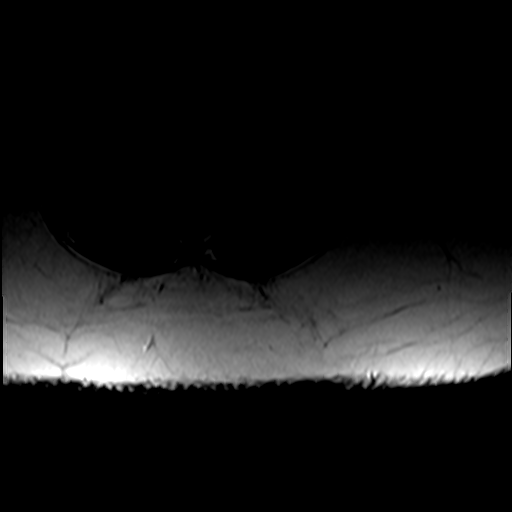
[im 11/39]
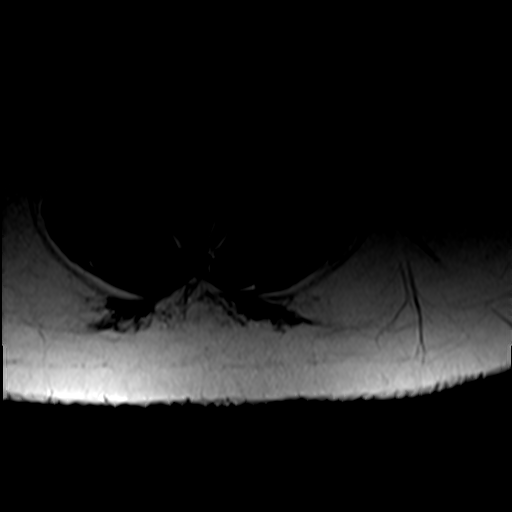
[im 17/39]
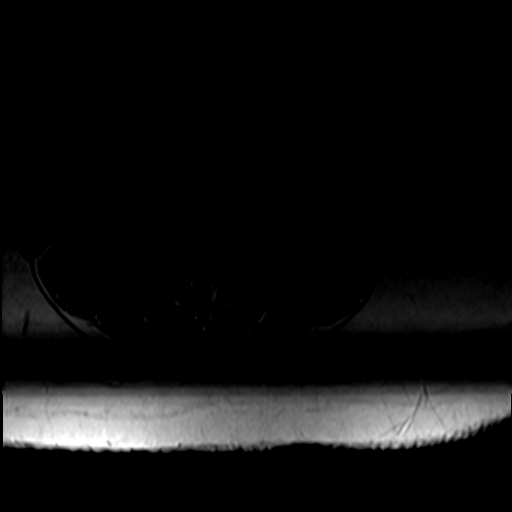
[im 20/39]
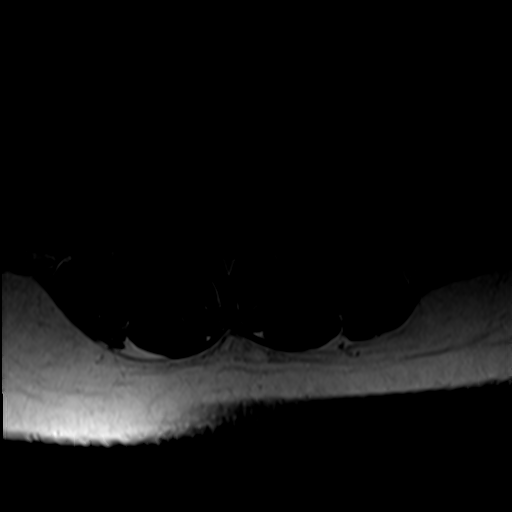
[im 33/39]
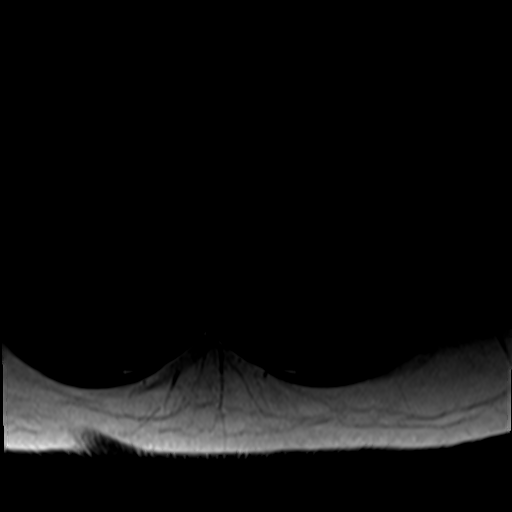

[27 of 48 positions shown; findings below may reference images not displayed]

FINDINGS: Evaluation is somewhat limited by motion artifact on the axial T2
sequence.

Segmentation:  Standard.

Alignment:  Physiologic.

Vertebrae:  No fracture, evidence of discitis, or bone lesion.

Conus medullaris and cauda equina: Conus extends to the T12-L1
level. Conus and cauda equina appear normal.

Paraspinal and other soft tissues: Negative.

Disc levels:

T12-L1: No significant disc bulge. No spinal canal stenosis or
neural foraminal narrowing.

L1-L2: No significant disc bulge. No spinal canal stenosis or neural
foraminal narrowing.

L2-L3: No significant disc bulge. No spinal canal stenosis or neural
foraminal narrowing.

L3-L4: No significant disc bulge. No spinal canal stenosis or neural
foraminal narrowing.

L4-L5: No significant disc bulge. No spinal canal stenosis or neural
foraminal narrowing.

L5-S1: Disc desiccation, disc height loss, and mild disc bulge with
superimposed central/left paracentral disc protrusion, which may
contact the descending left S1 nerve. No spinal canal stenosis or
neural foraminal narrowing.
IMPRESSION: 1. Evaluation is somewhat limited by motion artifact on the axial T2
sequence. Within this limitation, there is a central/left
paracentral disc protrusion at L5-S1, which may contact the
descending left S1 nerve.
2. No spinal canal stenosis or neural foraminal narrowing.

## 2022-02-02 DIAGNOSIS — Z124 Encounter for screening for malignant neoplasm of cervix: Secondary | ICD-10-CM | POA: Diagnosis not present

## 2022-02-02 DIAGNOSIS — N390 Urinary tract infection, site not specified: Secondary | ICD-10-CM | POA: Diagnosis not present

## 2022-02-02 DIAGNOSIS — Z1389 Encounter for screening for other disorder: Secondary | ICD-10-CM | POA: Diagnosis not present

## 2022-02-02 DIAGNOSIS — R8290 Unspecified abnormal findings in urine: Secondary | ICD-10-CM | POA: Diagnosis not present

## 2022-02-16 DIAGNOSIS — N939 Abnormal uterine and vaginal bleeding, unspecified: Secondary | ICD-10-CM | POA: Diagnosis not present

## 2022-03-14 DIAGNOSIS — N390 Urinary tract infection, site not specified: Secondary | ICD-10-CM | POA: Diagnosis not present

## 2022-04-05 DIAGNOSIS — J0100 Acute maxillary sinusitis, unspecified: Secondary | ICD-10-CM | POA: Diagnosis not present

## 2022-04-05 DIAGNOSIS — Z6830 Body mass index (BMI) 30.0-30.9, adult: Secondary | ICD-10-CM | POA: Diagnosis not present

## 2022-04-05 DIAGNOSIS — R051 Acute cough: Secondary | ICD-10-CM | POA: Diagnosis not present

## 2022-04-19 DIAGNOSIS — J45909 Unspecified asthma, uncomplicated: Secondary | ICD-10-CM | POA: Diagnosis not present

## 2022-04-19 DIAGNOSIS — F418 Other specified anxiety disorders: Secondary | ICD-10-CM | POA: Diagnosis not present

## 2022-05-29 ENCOUNTER — Inpatient Hospital Stay (HOSPITAL_COMMUNITY): Admission: RE | Admit: 2022-05-29 | Payer: Managed Care, Other (non HMO) | Source: Ambulatory Visit

## 2022-05-29 ENCOUNTER — Encounter: Payer: Self-pay | Admitting: Emergency Medicine

## 2022-05-29 ENCOUNTER — Ambulatory Visit
Admission: EM | Admit: 2022-05-29 | Discharge: 2022-05-29 | Disposition: A | Discharge status: Home / Self Care | Payer: BC Managed Care – PPO | Attending: Nurse Practitioner | Admitting: Nurse Practitioner

## 2022-05-29 DIAGNOSIS — F1291 Cannabis use, unspecified, in remission: Secondary | ICD-10-CM | POA: Insufficient documentation

## 2022-05-29 DIAGNOSIS — R6889 Other general symptoms and signs: Secondary | ICD-10-CM | POA: Diagnosis not present

## 2022-05-29 DIAGNOSIS — Z1152 Encounter for screening for COVID-19: Secondary | ICD-10-CM | POA: Insufficient documentation

## 2022-05-29 DIAGNOSIS — J45909 Unspecified asthma, uncomplicated: Secondary | ICD-10-CM | POA: Diagnosis not present

## 2022-05-29 DIAGNOSIS — R051 Acute cough: Secondary | ICD-10-CM | POA: Diagnosis not present

## 2022-05-29 DIAGNOSIS — K589 Irritable bowel syndrome without diarrhea: Secondary | ICD-10-CM | POA: Diagnosis not present

## 2022-05-29 DIAGNOSIS — Z7951 Long term (current) use of inhaled steroids: Secondary | ICD-10-CM | POA: Diagnosis not present

## 2022-05-29 DIAGNOSIS — Z28310 Unvaccinated for covid-19: Secondary | ICD-10-CM | POA: Insufficient documentation

## 2022-05-29 MED ORDER — OSELTAMIVIR PHOSPHATE 75 MG PO CAPS: 75.0000 mg | ORAL_CAPSULE | Freq: Two times a day (BID) | ORAL | 0 refills | Status: DC

## 2022-05-29 NOTE — ED Provider Notes (Signed)
[facility]    CSN: [id number] Arrival date & time: 05/29/22  1459      History   Chief Complaint Chief Complaint  Patient presents with   Generalized Body Aches    HPI Cheyenne Sutton is a 32 y.o. female  presents for evaluation of URI symptoms for 2 days. Patient reports associated symptoms of years, chills, body aches, cough, congestion, sore throat, nausea. Denies vomiting, diarrhea, ear pain, shortness of breath. Patient does have a hx of asthma or smoking.  She has an albuterol inhaler and has been using to help her cough.  Denies wheezing or shortness of breath.  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  No recent travel. Pt is not vaccinated for COVID. Pt is not vaccinated for flu this season.  Patient is breast-feeding.  Pt has taken Tylenol OTC for symptoms. Pt has no other concerns at this time.   HPI  Past Medical History:  Diagnosis Date   Anxiety    Asthma    Depression    Folliculitis    Hernia    Hydradenitis    IBS (irritable bowel syndrome)    Panic attacks     Patient Active Problem List   Diagnosis Date Noted   S/P cesarean section 12/22/2021   Encounter for induction of labor 12/21/2021   Status post primary low transverse cesarean section 11/17/2018   Abnormal fetal ultrasound 11/16/2018   Oligohydramnios 11/16/2018    Past Surgical History:  Procedure Laterality Date   CESAREAN SECTION N/A 11/16/2018   Procedure: CESAREAN SECTION;  Surgeon: Sloan Leiter, MD;  Location: [facility];  Service: Obstetrics;  Laterality: N/A;   CESAREAN SECTION N/A 12/22/2021   Procedure: CESAREAN SECTION;  Surgeon: Dian Queen, MD;  Location: [facility];  Service: Obstetrics;  Laterality: N/A;   HERNIA REPAIR      OB History     Gravida  3   Para  2   Term  2   Preterm      AB  1   Living  2      SAB      IAB  1   Ectopic      Multiple  0   Live Births  2            Home Medications    Prior to  Admission medications   Medication Sig Start Date End Date Taking? Authorizing Provider  norethindrone-ethinyl estradiol-iron Ronnette Juniper FE 1.5/30) 1.5-30 MG-MCG tablet  04/01/22  Yes [provider]  oseltamivir (TAMIFLU) 75 MG capsule Take 1 capsule (75 mg total) by mouth every 12 (twelve) hours. 05/29/22  Yes Melynda Ripple, NP  sertraline (ZOLOFT) 50 MG tablet TAKE 1 TABLET BY MOUTH ONCE DAILY FOR 90 DAYS 04/01/22  Yes [provider]  acetaminophen (TYLENOL) 325 MG tablet Take 2 tablets (650 mg total) by mouth every 6 (six) hours as needed for mild pain (temperature > 101.5.). 12/24/21   Everlene Farrier, MD  ibuprofen (ADVIL) 800 MG tablet Take 1 tablet (800 mg total) by mouth every 8 (eight) hours as needed. 11/19/18   Gavin Pound, CNM  Prenatal Vit-Fe Fumarate-FA (PRENATAL MULTIVITAMIN) TABS tablet Take 1 tablet by mouth daily at 12 noon.    [provider]    Family History History reviewed. No pertinent family history.  Social History Social History   Tobacco Use   Smoking status: Never   Smokeless tobacco: Never  Vaping  Use   Vaping Use: Never used  Substance Use Topics   Alcohol use: Not Currently   Drug use: Not Currently    Types: Marijuana    Comment: previous marijuana use-reports none since finding out about pregnancy     Allergies   Patient has no known allergies.   Review of Systems Review of Systems  Constitutional:  Positive for fever.  HENT:  Positive for congestion and sore throat.   Respiratory:  Positive for cough.   Gastrointestinal:  Positive for nausea.  Musculoskeletal:  Positive for myalgias.     Physical Exam Triage Vital Signs ED Triage Vitals  Enc Vitals Group     BP 05/29/22 1503 102/65     Pulse Rate 05/29/22 1503 (!) 106     Resp 05/29/22 1503 15     Temp 05/29/22 1503 98 F (36.7 C)     Temp Source 05/29/22 1503 Oral     SpO2 05/29/22 1503 97 %     Weight 05/29/22 1508 185 lb (83.9 kg)     Height 05/29/22  1508 5\' 4"  (1.626 m)     Head Circumference --      Peak Flow --      Pain Score 05/29/22 1508 0     Pain Loc --      Pain Edu? --      Excl. in [place]? --    No data found.  Updated Vital Signs BP 102/65 (BP Location: Right Arm)   Pulse (!) 106   Temp 98 F (36.7 C) (Oral)   Resp 15   Ht 5\' 4"  (1.626 m)   Wt 185 lb (83.9 kg)   LMP 05/02/2022   SpO2 97%   Breastfeeding Yes   BMI 31.76 kg/m   Visual Acuity Right Eye Distance:   Left Eye Distance:   Bilateral Distance:    Right Eye Near:   Left Eye Near:    Bilateral Near:     Physical Exam Vitals and nursing note reviewed.  Constitutional:      General: She is not in acute distress.    Appearance: She is well-developed. She is not ill-appearing.  HENT:     Head: Normocephalic and atraumatic.     Right Ear: Tympanic membrane and ear canal normal.     Left Ear: Tympanic membrane and ear canal normal.     Nose: Congestion present.     Mouth/Throat:     Mouth: Mucous membranes are moist.     Pharynx: Oropharynx is clear. Uvula midline. Posterior oropharyngeal erythema present.     Tonsils: No tonsillar exudate or tonsillar abscesses.  Eyes:     Conjunctiva/sclera: Conjunctivae normal.     Pupils: Pupils are equal, round, and reactive to light.  Cardiovascular:     Rate and Rhythm: Normal rate and regular rhythm.     Heart sounds: Normal heart sounds.  Pulmonary:     Effort: Pulmonary effort is normal.     Breath sounds: Normal breath sounds.  Musculoskeletal:     Cervical back: Normal range of motion and neck supple.  Lymphadenopathy:     Cervical: No cervical adenopathy.  Skin:    General: Skin is warm and dry.  Neurological:     General: No focal deficit present.     Mental Status: She is alert and oriented to person, place, and time.  Psychiatric:        Mood and Affect: Mood normal.  Behavior: Behavior normal.      UC Treatments / Results  Labs (all labs ordered are listed, but only abnormal  results are displayed) Labs Reviewed  SARS CORONAVIRUS 2 (TAT 6-24 HRS)    EKG   Radiology No results found.  Procedures Procedures (including critical care time)  Medications Ordered in UC Medications - No data to display  Initial Impression / Assessment and Plan / UC Course  I have reviewed the triage vital signs and the nursing notes.  Pertinent labs & imaging results that were available during my care of the patient were reviewed by me and considered in my medical decision making (see chart for details).     Reviewed exam and symptoms with patient.  No red flags on exam. Discussed flulike symptoms, start Tamiflu Continue OTC cough medicine as needed Rest and fluids PCP follow-up 2 to 3 days for recheck ER precautions reviewed and patient verbalized understanding Final Clinical Impressions(s) / UC Diagnoses   Final diagnoses:  Acute cough  Flu-like symptoms     Discharge Instructions      Tamiflu twice daily for 5 days. Continue over-the-counter cough medications as needed May continue Tylenol or ibuprofen as needed Rest and fluids The clinic will contact you with result of your COVID test is positive Please go to the emergency room if you have any worsening symptoms Please follow-up with your PCP in 2 to 3 days for recheck    ED Prescriptions     Medication Sig Dispense Auth. Provider   oseltamivir (TAMIFLU) 75 MG capsule Take 1 capsule (75 mg total) by mouth every 12 (twelve) hours. 10 capsule Radford Pax, NP      PDMP not reviewed this encounter.   Radford Pax, NP 05/29/22 [phone]

## 2022-05-29 NOTE — ED Triage Notes (Signed)
Patient c/o headaches, vomiting, bodyaches and chills, loss taste of smell since Friday.  Patient has taken Tylenol.

## 2022-05-29 NOTE — Discharge Instructions (Signed)
Tamiflu twice daily for 5 days. Continue over-the-counter cough medications as needed May continue Tylenol or ibuprofen as needed Rest and fluids The clinic will contact you with result of your COVID test is positive Please go to the emergency room if you have any worsening symptoms Please follow-up with your PCP in 2 to 3 days for recheck

## 2022-05-30 LAB — SARS CORONAVIRUS 2 (TAT 6-24 HRS): SARS Coronavirus 2: NEGATIVE

## 2022-08-07 ENCOUNTER — Encounter (HOSPITAL_COMMUNITY): Payer: Self-pay

## 2022-08-07 ENCOUNTER — Ambulatory Visit (HOSPITAL_COMMUNITY)
Admission: RE | Admit: 2022-08-07 | Discharge: 2022-08-07 | Disposition: A | Discharge status: Home / Self Care | Payer: BC Managed Care – PPO | Source: Ambulatory Visit | Attending: Emergency Medicine | Admitting: Emergency Medicine

## 2022-08-07 VITALS — BP 108/76 | HR 93 | Temp 99.1°F | Resp 16

## 2022-08-07 DIAGNOSIS — Z113 Encounter for screening for infections with a predominantly sexual mode of transmission: Secondary | ICD-10-CM | POA: Insufficient documentation

## 2022-08-07 DIAGNOSIS — N898 Other specified noninflammatory disorders of vagina: Secondary | ICD-10-CM | POA: Diagnosis not present

## 2022-08-07 LAB — POCT URINALYSIS DIPSTICK, ED / UC
Bilirubin Urine: NEGATIVE
Glucose, UA: NEGATIVE mg/dL
Hgb urine dipstick: NEGATIVE
Nitrite: POSITIVE — AB
Protein, ur: NEGATIVE mg/dL
Specific Gravity, Urine: 1.02 (ref 1.005–1.030)
Urobilinogen, UA: 1 mg/dL (ref 0.0–1.0)
pH: 6 (ref 5.0–8.0)

## 2022-08-07 LAB — POC URINE PREG, ED: Preg Test, Ur: NEGATIVE

## 2022-08-07 NOTE — ED Triage Notes (Signed)
Pt is here for vaginal irritation with white discharge x 5 days

## 2022-08-07 NOTE — Discharge Instructions (Addendum)
We will call you if anything on your swab returns positive. Please abstain from sexual intercourse until your results return.  Continue to drink lots of fluids  Your urine had some signs of infection, but since you are not having symptoms we will wait for the culture. If the culture grows a lot of bacteria, we will call you and treat you as needed.

## 2022-08-07 NOTE — ED Provider Notes (Signed)
[facility]    CSN: [id number] Arrival date & time: 08/07/22  1653      History   Chief Complaint Chief Complaint  Patient presents with   Vaginal Itching    Entered by patient    HPI Cheyenne Sutton is a 32 y.o. female.  Here with 5-day history of vaginal irritation, white discharge Denies odor, rash, lesion, abd pain. No dysuria, frequency, urgency.  Sexually active. No known STD exposure. LMP 3/13  Reports frequent UTIs. Will have no symptoms but UA will show infection   Past Medical History:  Diagnosis Date   Anxiety    Asthma    Depression    Folliculitis    Hernia    Hydradenitis    IBS (irritable bowel syndrome)    Panic attacks     Patient Active Problem List   Diagnosis Date Noted   S/P cesarean section 12/22/2021   Encounter for induction of labor 12/21/2021   Status post primary low transverse cesarean section 11/17/2018   Abnormal fetal ultrasound 11/16/2018   Oligohydramnios 11/16/2018    Past Surgical History:  Procedure Laterality Date   CESAREAN SECTION N/A 11/16/2018   Procedure: CESAREAN SECTION;  Surgeon: Conan Bowens, MD;  Location: [facility];  Service: Obstetrics;  Laterality: N/A;   CESAREAN SECTION N/A 12/22/2021   Procedure: CESAREAN SECTION;  Surgeon: Marcelle Overlie, MD;  Location: [facility];  Service: Obstetrics;  Laterality: N/A;   HERNIA REPAIR      OB History     Gravida  3   Para  2   Term  2   Preterm      AB  1   Living  2      SAB      IAB  1   Ectopic      Multiple  0   Live Births  2            Home Medications    Prior to Admission medications   Medication Sig Start Date End Date Taking? Authorizing Provider  acetaminophen (TYLENOL) 325 MG tablet Take 2 tablets (650 mg total) by mouth every 6 (six) hours as needed for mild pain (temperature > 101.5.). 12/24/21  Yes Harold Hedge, MD  norethindrone-ethinyl estradiol-iron Elmarie Shiley FE 1.5/30) 1.5-30 MG-MCG tablet  04/01/22  Yes  [provider]  Prenatal Vit-Fe Fumarate-FA (PRENATAL MULTIVITAMIN) TABS tablet Take 1 tablet by mouth daily at 12 noon.   Yes [provider]  sertraline (ZOLOFT) 50 MG tablet TAKE 1 TABLET BY MOUTH ONCE DAILY FOR 90 DAYS 04/01/22  Yes [provider]  ibuprofen (ADVIL) 800 MG tablet Take 1 tablet (800 mg total) by mouth every 8 (eight) hours as needed. 11/19/18   Gerrit Heck, CNM    Family History History reviewed. No pertinent family history.  Social History Social History   Tobacco Use   Smoking status: Never   Smokeless tobacco: Never  Vaping Use   Vaping Use: Never used  Substance Use Topics   Alcohol use: Not Currently   Drug use: Not Currently    Types: Marijuana    Comment: previous marijuana use-reports none since finding out about pregnancy     Allergies   Patient has no known allergies.   Review of Systems Review of Systems  As per HPI  Physical Exam Triage Vital Signs ED Triage Vitals [08/07/22 1705]  Enc Vitals Group     BP      Pulse  Resp      Temp      Temp src      SpO2      Weight      Height      Head Circumference      Peak Flow      Pain Score 4     Pain Loc      Pain Edu?      Excl. in [place]?    No data found.  Updated Vital Signs BP 108/76 (BP Location: Right Arm)   Pulse 93   Temp 99.1 F (37.3 C) (Oral)   Resp 16   LMP 07/13/2022   SpO2 96%   Breastfeeding Yes   Physical Exam Vitals and nursing note reviewed.  Constitutional:      General: She is not in acute distress.    Appearance: Normal appearance.  HENT:     Mouth/Throat:     Mouth: Mucous membranes are moist.     Pharynx: Oropharynx is clear.  Eyes:     Conjunctiva/sclera: Conjunctivae normal.     Pupils: Pupils are equal, round, and reactive to light.  Cardiovascular:     Rate and Rhythm: Normal rate and regular rhythm.     Pulses: Normal pulses.     Heart sounds: Normal heart sounds.  Pulmonary:     Effort: Pulmonary effort  is normal.     Breath sounds: Normal breath sounds.  Abdominal:     General: Bowel sounds are normal.     Palpations: Abdomen is soft.     Tenderness: There is no abdominal tenderness. There is no right CVA tenderness or left CVA tenderness.  Neurological:     Mental Status: She is alert and oriented to person, place, and time.      UC Treatments / Results  Labs (all labs ordered are listed, but only abnormal results are displayed) Labs Reviewed  POCT URINALYSIS DIPSTICK, ED / UC - Abnormal; Notable for the following components:      Result Value   Ketones, ur TRACE (*)    Nitrite POSITIVE (*)    Leukocytes,Ua SMALL (*)    All other components within normal limits  URINE CULTURE  POC URINE PREG, ED  CERVICOVAGINAL ANCILLARY ONLY    EKG   Radiology No results found.  Procedures Procedures (including critical care time)  Medications Ordered in UC Medications - No data to display  Initial Impression / Assessment and Plan / UC Course  I have reviewed the triage vital signs and the nursing notes.  Pertinent labs & imaging results that were available during my care of the patient were reviewed by me and considered in my medical decision making (see chart for details).  UPT negative  UA +nitrates, small leuks. Culture is pending. She is asymptomatic; will wait for culture and treat if needed. Cytology swab is pending. Will treat positive result as indicated No questions at this time  Final Clinical Impressions(s) / UC Diagnoses   Final diagnoses:  Vaginal discharge  Screen for STD (sexually transmitted disease)     Discharge Instructions      We will call you if anything on your swab returns positive. Please abstain from sexual intercourse until your results return.  Continue to drink lots of fluids  Your urine had some signs of infection, but since you are not having symptoms we will wait for the culture. If the culture grows a lot of bacteria, we will call  you and treat you as  needed.     ED Prescriptions   None    PDMP not reviewed this encounter.   Carli Lefevers, Ray [place] 08/07/22 [id number]

## 2022-08-08 ENCOUNTER — Telehealth (HOSPITAL_COMMUNITY): Payer: Self-pay | Admitting: Emergency Medicine

## 2022-08-08 LAB — CERVICOVAGINAL ANCILLARY ONLY
Bacterial Vaginitis (gardnerella): POSITIVE — AB
Candida Glabrata: NEGATIVE
Candida Vaginitis: NEGATIVE
Chlamydia: NEGATIVE
Comment: NEGATIVE
Comment: NEGATIVE
Comment: NEGATIVE
Comment: NEGATIVE
Comment: NEGATIVE
Comment: NORMAL
Neisseria Gonorrhea: NEGATIVE
Trichomonas: NEGATIVE

## 2022-08-08 MED ORDER — METRONIDAZOLE 0.75 % VA GEL: 1.0000 | Freq: Every day | VAGINAL | 0 refills | Status: AC

## 2022-08-09 ENCOUNTER — Telehealth (HOSPITAL_COMMUNITY): Payer: Self-pay | Admitting: Emergency Medicine

## 2022-08-09 LAB — URINE CULTURE: Culture: >=100000 — AB

## 2022-08-09 MED ORDER — NITROFURANTOIN MONOHYD MACRO 100 MG PO CAPS: 100.0000 mg | ORAL_CAPSULE | Freq: Two times a day (BID) | ORAL | 0 refills | Status: AC

## 2022-09-30 DIAGNOSIS — R8290 Unspecified abnormal findings in urine: Secondary | ICD-10-CM | POA: Diagnosis not present

## 2022-09-30 DIAGNOSIS — R222 Localized swelling, mass and lump, trunk: Secondary | ICD-10-CM | POA: Diagnosis not present

## 2022-09-30 DIAGNOSIS — N63 Unspecified lump in unspecified breast: Secondary | ICD-10-CM | POA: Diagnosis not present

## 2022-09-30 DIAGNOSIS — J309 Allergic rhinitis, unspecified: Secondary | ICD-10-CM | POA: Diagnosis not present

## 2022-09-30 DIAGNOSIS — Z0000 Encounter for general adult medical examination without abnormal findings: Secondary | ICD-10-CM | POA: Diagnosis not present

## 2022-09-30 DIAGNOSIS — F418 Other specified anxiety disorders: Secondary | ICD-10-CM | POA: Diagnosis not present

## 2022-10-05 ENCOUNTER — Other Ambulatory Visit: Payer: Self-pay | Admitting: Family Medicine

## 2022-10-05 DIAGNOSIS — N63 Unspecified lump in unspecified breast: Secondary | ICD-10-CM

## 2022-10-27 ENCOUNTER — Ambulatory Visit
Admission: RE | Admit: 2022-10-27 | Discharge: 2022-10-27 | Disposition: A | Discharge status: Home / Self Care | Payer: BC Managed Care – PPO | Source: Ambulatory Visit | Attending: Family Medicine | Admitting: Family Medicine

## 2022-10-27 DIAGNOSIS — N63 Unspecified lump in unspecified breast: Secondary | ICD-10-CM

## 2022-10-27 DIAGNOSIS — N6315 Unspecified lump in the right breast, overlapping quadrants: Secondary | ICD-10-CM | POA: Diagnosis not present

## 2022-11-08 DIAGNOSIS — D2372 Other benign neoplasm of skin of left lower limb, including hip: Secondary | ICD-10-CM | POA: Diagnosis not present

## 2022-11-08 DIAGNOSIS — L732 Hidradenitis suppurativa: Secondary | ICD-10-CM | POA: Diagnosis not present

## 2022-11-08 DIAGNOSIS — L650 Telogen effluvium: Secondary | ICD-10-CM | POA: Diagnosis not present

## 2023-03-12 DIAGNOSIS — R300 Dysuria: Secondary | ICD-10-CM | POA: Diagnosis not present

## 2023-04-05 DIAGNOSIS — F418 Other specified anxiety disorders: Secondary | ICD-10-CM | POA: Diagnosis not present

## 2023-04-05 DIAGNOSIS — N390 Urinary tract infection, site not specified: Secondary | ICD-10-CM | POA: Diagnosis not present

## 2023-04-05 DIAGNOSIS — R8290 Unspecified abnormal findings in urine: Secondary | ICD-10-CM | POA: Diagnosis not present

## 2023-06-07 DIAGNOSIS — J101 Influenza due to other identified influenza virus with other respiratory manifestations: Secondary | ICD-10-CM | POA: Diagnosis not present

## 2023-06-07 DIAGNOSIS — R051 Acute cough: Secondary | ICD-10-CM | POA: Diagnosis not present

## 2023-06-07 DIAGNOSIS — Z03818 Encounter for observation for suspected exposure to other biological agents ruled out: Secondary | ICD-10-CM | POA: Diagnosis not present

## 2023-06-07 DIAGNOSIS — R509 Fever, unspecified: Secondary | ICD-10-CM | POA: Diagnosis not present

## 2023-08-09 DIAGNOSIS — N898 Other specified noninflammatory disorders of vagina: Secondary | ICD-10-CM | POA: Diagnosis not present

## 2023-08-09 DIAGNOSIS — N760 Acute vaginitis: Secondary | ICD-10-CM | POA: Diagnosis not present

## 2023-08-09 DIAGNOSIS — Z113 Encounter for screening for infections with a predominantly sexual mode of transmission: Secondary | ICD-10-CM | POA: Diagnosis not present

## 2023-08-09 DIAGNOSIS — Z3202 Encounter for pregnancy test, result negative: Secondary | ICD-10-CM | POA: Diagnosis not present

## 2023-08-09 DIAGNOSIS — N939 Abnormal uterine and vaginal bleeding, unspecified: Secondary | ICD-10-CM | POA: Diagnosis not present

## 2023-08-30 DIAGNOSIS — R319 Hematuria, unspecified: Secondary | ICD-10-CM | POA: Diagnosis not present

## 2023-08-30 DIAGNOSIS — Z6832 Body mass index (BMI) 32.0-32.9, adult: Secondary | ICD-10-CM | POA: Diagnosis not present

## 2023-08-30 DIAGNOSIS — Z113 Encounter for screening for infections with a predominantly sexual mode of transmission: Secondary | ICD-10-CM | POA: Diagnosis not present

## 2023-08-30 DIAGNOSIS — Z1151 Encounter for screening for human papillomavirus (HPV): Secondary | ICD-10-CM | POA: Diagnosis not present

## 2023-08-30 DIAGNOSIS — Z01419 Encounter for gynecological examination (general) (routine) without abnormal findings: Secondary | ICD-10-CM | POA: Diagnosis not present

## 2023-08-30 DIAGNOSIS — Z124 Encounter for screening for malignant neoplasm of cervix: Secondary | ICD-10-CM | POA: Diagnosis not present

## 2023-09-26 DIAGNOSIS — N939 Abnormal uterine and vaginal bleeding, unspecified: Secondary | ICD-10-CM | POA: Diagnosis not present

## 2023-09-29 DIAGNOSIS — Z0000 Encounter for general adult medical examination without abnormal findings: Secondary | ICD-10-CM | POA: Diagnosis not present

## 2023-09-29 DIAGNOSIS — M79672 Pain in left foot: Secondary | ICD-10-CM | POA: Diagnosis not present

## 2023-09-29 DIAGNOSIS — F418 Other specified anxiety disorders: Secondary | ICD-10-CM | POA: Diagnosis not present

## 2023-11-13 DIAGNOSIS — D2372 Other benign neoplasm of skin of left lower limb, including hip: Secondary | ICD-10-CM | POA: Diagnosis not present

## 2024-02-15 DIAGNOSIS — F411 Generalized anxiety disorder: Secondary | ICD-10-CM | POA: Diagnosis not present

## 2024-02-21 DIAGNOSIS — F411 Generalized anxiety disorder: Secondary | ICD-10-CM | POA: Diagnosis not present

## 2024-03-13 DIAGNOSIS — F411 Generalized anxiety disorder: Secondary | ICD-10-CM | POA: Diagnosis not present

## 2024-04-10 DIAGNOSIS — F411 Generalized anxiety disorder: Secondary | ICD-10-CM | POA: Diagnosis not present

## 2024-05-07 ENCOUNTER — Ambulatory Visit: Admitting: Dermatology
